# Patient Record
Sex: Male | Born: 1935
Health system: Southern US, Community
[De-identification: ages and names within clinical notes are randomized; demographics above are authoritative.]

## PROBLEM LIST (undated history)

## (undated) DIAGNOSIS — K4090 Unilateral inguinal hernia, without obstruction or gangrene, not specified as recurrent: Secondary | ICD-10-CM

## (undated) DIAGNOSIS — I10 Essential (primary) hypertension: Secondary | ICD-10-CM

## (undated) DIAGNOSIS — J3489 Other specified disorders of nose and nasal sinuses: Secondary | ICD-10-CM

## (undated) DIAGNOSIS — R609 Edema, unspecified: Secondary | ICD-10-CM

## (undated) DIAGNOSIS — H547 Unspecified visual loss: Secondary | ICD-10-CM

## (undated) DIAGNOSIS — F0280 Dementia in other diseases classified elsewhere without behavioral disturbance: Secondary | ICD-10-CM

## (undated) DIAGNOSIS — R350 Frequency of micturition: Secondary | ICD-10-CM

## (undated) DIAGNOSIS — B079 Viral wart, unspecified: Secondary | ICD-10-CM

## (undated) DIAGNOSIS — M549 Dorsalgia, unspecified: Secondary | ICD-10-CM

## (undated) DIAGNOSIS — H04123 Dry eye syndrome of bilateral lacrimal glands: Secondary | ICD-10-CM

## (undated) DIAGNOSIS — G309 Alzheimer's disease, unspecified: Secondary | ICD-10-CM

## (undated) DIAGNOSIS — H409 Unspecified glaucoma: Secondary | ICD-10-CM

## (undated) DIAGNOSIS — R3915 Urgency of urination: Secondary | ICD-10-CM

## (undated) DIAGNOSIS — R531 Weakness: Secondary | ICD-10-CM

## (undated) DIAGNOSIS — K649 Unspecified hemorrhoids: Secondary | ICD-10-CM

## (undated) DIAGNOSIS — G8929 Other chronic pain: Secondary | ICD-10-CM

## (undated) HISTORY — DX: Unspecified glaucoma: H40.9

## (undated) HISTORY — PX: EYE SURGERY: SHX253

## (undated) HISTORY — PX: HEMICOLECTOMY: SHX854

## (undated) HISTORY — PX: TONSILLECTOMY: SUR1361

## (undated) HISTORY — DX: Essential (primary) hypertension: I10

## (undated) HISTORY — PX: COLONOSCOPY: SHX174

---

## 2006-02-27 HISTORY — PX: HERNIA REPAIR: SHX51

## 2006-10-29 ENCOUNTER — Inpatient Hospital Stay (HOSPITAL_COMMUNITY): Admission: EM | Admit: 2006-10-29 | Discharge: 2006-11-01 | Payer: Self-pay | Admitting: Emergency Medicine

## 2010-07-12 NOTE — Op Note (Signed)
NAME:  Anthony Tyler, Anthony Tyler NO.:  [id number]   MEDICAL RECORD NO.:  [id number]          PATIENT TYPE:  INP   LOCATION:  5703                         FACILITY:  [facility]   PHYSICIAN:  Adolph Pollack, M.D.DATE OF BIRTH:  1930/04/21   DATE OF PROCEDURE:  DATE OF DISCHARGE:                               OPERATIVE REPORT   PREOPERATIVE DIAGNOSIS:  Incarcerated right inguinal hernia leading to a  partial large bowel obstruction.   POSTOPERATIVE DIAGNOSIS:  Incarcerated right inguinal hernia leading to  a partial large bowel obstruction.   PROCEDURE:  Repair of incarcerated right inguinal hernia with mesh.   ANESTHESIA:  General.   INDICATIONS:  The patient is a 77 year old male states he has had a  large right inguinal hernia that has descended into his scrotum over  time.  He has no primary care physician.  He fell off a ladder and then  began noticing some abdominal distention, constipation but was passing  some gas, began having some cramping and nausea.  He presented to the  emergency department and was found to have a large bowel obstruction  which was felt to be secondary to sigmoid colon and this large right  inguinal hernia.  Hernia is only temporarily reducible and comes right  back out and he is now brought to the operating room for urgent repair.   TECHNIQUE:  He is seen in the holding area and the right groin marked  with my initials.  He was then brought to the operating room, placed on  the operating table and general anesthetic was administered.  A Foley  catheter was placed in the bladder.  The hair on the groin and the  abdominal wall was clipped and the area sterilely prepped and draped.  Marcaine solution was infiltrated in the right groin and right groin  incision was made through the skin and subcutaneous tissue and Scarpas  fascia until the external oblique aponeurosis was exposed.  I made a  small incision in external oblique aponeurosis and  extended it through  the external ring medially and up toward the anterior superior iliac  spine laterally.  Using blunt dissection, I identified the shelving edge  of the inguinal ligament inferiorly and the internal oblique muscle  aponeurosis superiorly.  The inguinal nerve was identified and retracted  inferiorly away from the plane of dissection.  I then isolated the  spermatic cord and noticed a large sac with it.  I created a window  around this.  I then reduced the contents of the sac of the hernia.  Using careful blunt dissection and select electrocautery, I then  dissected a very large indirect hernia sac away off of the spermatic  cord as well as some extraperitoneal fat which I excised.  I then opened  up the sac and pulled out the sigmoid colon, inspected it.  It was  viable and I reduced it back into the peritoneal space.  High ligation  of the sac was then performed with the 2-0 Vicryl suture and excess sac  was cut off and sent to pathology.   Following this I  brought a 3 x 6 inch piece of polypropylene mesh into  the field and anchored it 2 cm medial to the pubic tubercle with 2-0  Prolene suture.  The inferior aspect of mesh was then anchored to the  shelving edge of the inguinal ligament with a running 2-0 Prolene suture  up to level 2 cm lateral to the internal ring.  A slit was cut in the  mesh, two tails wrapped around the cord.  The superior aspect of mesh  was then anchored to the internal oblique aponeurosis with interrupted 2-  0 Vicryl sutures.  Following this, the two tails of the mesh were  crossed creating a new internal ring and then anchored to the shelving  edge of the inguinal ligament with single 2-0 Prolene suture.  The  lateral aspect of the mesh was then tucked deep to the external oblique  aponeurosis.  I then inspected the area and hemostasis was adequate.  I  closed the external oblique aponeurosis over the mesh and cord with a  running 3-0   Vicryl suture.  Scarpas fascia was closed with a running 2-0 Vicryl  suture.  The skin was closed with a 4-0 Monocryl subcuticular stitch  followed by Steri-Strips and sterile dressing.  He tolerated the  procedure without any apparent complications and was taken to recovery  in satisfactory condition.      Adolph Pollack, M.D.  Electronically Signed     TJR/MEDQ  D:  10/29/2006  T:  10/29/2006  Job:  [id number]

## 2010-07-12 NOTE — H&P (Signed)
NAME:  Anthony Tyler, Anthony Tyler NO.:  [id number]   MEDICAL RECORD NO.:  [id number]          PATIENT TYPE:  EMS   LOCATION:  MAJO                         FACILITY:  [facility]   PHYSICIAN:  Adolph Pollack, M.D.DATE OF BIRTH:  1930/05/18   DATE OF ADMISSION:  10/29/2006  DATE OF DISCHARGE:                              HISTORY & PHYSICAL   REASON FOR ADMISSION:  Colonic obstruction.   HISTORY OF PRESENT ILLNESS:  This is a 77 year old male who according to  him has not been to a physician in 50 years.  He has had a known right  inguinal hernia to himself for the past 20 years.  It has been slowly  getting larger.  Two days ago, he was on a ladder and fell striking the  right paralumbar area of his back.  Following that, he reports  constipation, abdominal bloating and crampy abdominal pain with nausea,  but no vomiting.  No fever or chills.  Passing some gas.  He tried an  enema last night without success.  He then presented to the emergency  department for evaluation where he was found to have a large colonic  bowel obstruction secondary to a large right inguinal hernia containing  sigmoid colon.  It was at this point I was asked to see him.   PAST MEDICAL HISTORY:  1. Hypertension that is untreated as he has no primary care physician.  2. Nephrolithiasis.   PAST SURGICAL HISTORY:  Tonsillectomy.   ALLERGIES:  NONE.   MEDICATIONS:  None.   SOCIAL HISTORY:  Married and here with his wife.  No tobacco or alcohol  use.   REVIEW OF SYSTEMS:  GENERAL:  Generally he was in his normal state of  health prior to this.  CARDIOVASCULAR:  He says he has known that he has  had high blood pressure for a long period of time, but has chosen not to  seek medical attention and have it treated.  No chest pain.  No known  heart disease.  PULMONARY:  No pneumonia, asthma or chronic obstructive  pulmonary disease.  GI:  No peptic ulcer disease, hepatitis or melena or  hematochezia.  GU:   Kidney stones as above.  NEUROLOGIC:  No strokes or  seizures.  HEMATOLOGIC:  No bleeding disorders, transfusions or blood  clots.   PHYSICAL EXAMINATION:  GENERAL:  Elderly male who is in no acute  distress, pleasant and cooperative.  VITAL SIGNS:  Temperature is 97.7, blood pressure is 185/101, pulse is  89.  EYES:  Extraocular muscles intact, no icterus.  NOSE:  Prominent with some irregularity, but no suspicious moles or  lesions.  NECK:  Supple without obvious masses.  RESPIRATORY:  Breath sounds are equal and clear.  Respirations  unlabored.  CARDIAC:  Heart demonstrates a regular rate, regular rhythm.  No murmur.  No lower extremity edema.  ABDOMEN:  Slightly firm, distended and tympanitic with very mild  epigastric tenderness but no peritoneal signs.  Hypoactive bowel sounds  are noted.  GU: There is a large right scrotal hernia that I can reduce but only  temporarily  as it pops back up fairly quickly.  He has a smaller left  inguinal hernia that is easily reducible.  Both testicles are descended.  MUSCULOSKELETAL:  He has good muscle tone, good range of motion.   LABORATORY DATA:  Hemoglobin 16.5,  platelet count 188,000.  Electrolytes are notable for a glucose of 134, creatinine 1.3.  Urinalysis negative.  Abdominal x-rays demonstrate colonic dilatation,  but no free air.  CT of the abdomen demonstrates colonic dilatation with  sigmoid colon going down into the scrotal area and this appears to be a  transition point for his partial colonic obstruction.   IMPRESSION:  1. Partial colon obstruction, most likely secondary to large right      inguinal hernia containing sigmoid colon.  Also, there is a chance      he could have a neoplastic process once this is reduced.  2. Hypertension, untreated.  The patient has no primary physician.  3. Mild dehydration.   PLAN:  Will admit to the hospital, start IV fluid hydration.  He will  need to go to the operating room for urgent  right inguinal hernia repair  with mesh and possible bowel resection.  I have also explained to him  that further workup of his large intestine may be needed following this  procedure.  We discussed the risks of procedure including but not  limited to bleeding, infection, wound healing problems, anesthesia,  accidental damage to intraabdominal organs, recurrence, chronic pain  from nerve entrapment, and testicular ischemia.  He and his wife seem to  understand this and agree with the plan.      Adolph Pollack, M.D.  Electronically Signed     TJR/MEDQ  D:  10/29/2006  T:  10/29/2006  Job:  [id number]

## 2010-07-15 NOTE — Discharge Summary (Signed)
NAME:  Anthony Tyler, Anthony Tyler NO.:  [id number]   MEDICAL RECORD NO.:  [id number]          PATIENT TYPE:  INP   LOCATION:  5703                         FACILITY:  [facility]   PHYSICIAN:  Cherylynn Ridges, M.D.    DATE OF BIRTH:  [birth year]   DATE OF ADMISSION:  10/29/2006  DATE OF DISCHARGE:  11/01/2006                               DISCHARGE SUMMARY   OPERATIVE PHYSICIAN:  Adolph Pollack, M.D.   CHIEF COMPLAINT/REASON FOR ADMISSION:  Mr. Muff is a 77 year old male  patient who has had a right inguinal hernia for 20 years, slowly  increasing in size.  Several days prior to admission he fell on a ladder  and struck the right paralumbar area of his back.  He developed  constipation, abdominal bloating and crampy abdominal pain and nausea,  no vomiting.  After this he was passing some flatus but was unable to  have bowel movement.  He presented to the ER because of continued pain  and was found to have a large colonic bowel obstruction secondary to a  large right inguinal hernia containing sigmoid colon.   On exam the patient was hypertensive but afebrile.  His abdomen was  slightly firm, distended and tympanitic with mild epigastric tenderness,  hypoactive bowel sounds, no peritoneal signs.  On genitourinary he was  found to have a right scrotal hernia that was very large, partially  reducible but popping up fairly quickly.  No other problems found on  clinical exam.   His white count was elevated at 15,500 with neutrophils of 80%, and a CT  scan demonstrated sigmoid colon tracking into the scrotal area with this  being a probable transition point for his partial colonic obstruction.   The patient was admitted with the following diagnoses:  1. Partial colon obstruction secondary to a large right inguinal      hernia with sigmoid colon.  2. Hypertension, untreated.  3. Mild dehydration.   HOSPITAL COURSE:  The patient was admitted and taken to the OR  emergently on  the date of admission by Dr. Abbey Chatters, where he  underwent repair of an incarcerated right inguinal hernia with mesh.  Per the operative note that there were no ischemic changes to the  intestine so no bowel resection was indicated.  The patient tolerated  the procedure well and was sent back to the general floor to recover.   Postop day #1, the patient was doing well.  He had bowel sounds.  The  wound was stable but he had a significant amount of swelling and  discoloration in the scrotal region.  His white count was down to  13,700.  He was still hypertensive so a Catapres patch was added.  His  Foley catheter was discontinued.   For the next several days the patient continued to improve.  His diet  was advanced.  X-ray showed no acute issues and by postoperative day #3,  he was deemed appropriate for discharge home.  He was still mildly  hypertensive and according to the patient, he had not followed up with a  doctor in several years  and therefore it was determined we would  discharge him on the Catapres patch with recommendations to establish  with a primary care physician after discharge for monitoring of the  hypertension.   DISCHARGE DIAGNOSES:  1. Repair of incarcerated right inguinal hernia causing partial      colonic obstruction.  2. New diagnosis of hypertension.   DISCHARGE MEDICATIONS:  Unfortunately, I am unable to locate the copy of  the discharge paperwork given to the patient after discharge.  I did  discharge this patient and I do know that I wrote him a prescription for  the Catapres patch which he was on here at the hospital, which is a TTS-  2 level.  He was also given medication for pain after discharge and at  the hospital, the patient was utilizing Percocet for pain, and our  routine instructions for any patient undergoing hernia repair is no  lifting for 4-6 weeks, shower for next 2 weeks and follow up with the  physician in 2 weeks.  Please call for  appointment.  In addition, as  noted in my progress notes, I have asked the patient to contact a  primary care physician in the area through his insurance company to  clarify who he can see and follow up regarding his hypertension.      Allison L. Rennis Harding, N.P.      Cherylynn Ridges, M.D.  Electronically Signed    ALE/MEDQ  D:  11/28/2006  T:  11/29/2006  Job:  [id number]   cc:   Adolph Pollack, M.D.

## 2010-12-09 LAB — BASIC METABOLIC PANEL
BUN: 13
CO2: 24
Chloride: 104
Chloride: 107
Creatinine, Ser: 1.23
GFR calc Af Amer: >60
GFR calc Af Amer: >60
Potassium: 4.1

## 2010-12-09 LAB — CBC
HCT: 42.2
HCT: 44.8
HCT: 47.9
MCHC: 34.3
MCV: 94.2
MCV: 95.4
MCV: 96.6
RBC: 4.37
RBC: 4.76
RBC: 5.02
WBC: 15.4 — ABNORMAL HIGH
WBC: 15.5 — ABNORMAL HIGH

## 2010-12-09 LAB — DIFFERENTIAL
Eosinophils Absolute: 0
Eosinophils Relative: 0
Lymphs Abs: 2.3
Monocytes Relative: 5

## 2010-12-09 LAB — URINALYSIS, ROUTINE W REFLEX MICROSCOPIC
Glucose, UA: NEGATIVE
Ketones, ur: 15 — AB
Protein, ur: 100 — AB

## 2011-03-31 HISTORY — PX: CATARACT EXTRACTION: SUR2

## 2011-06-19 DIAGNOSIS — J309 Allergic rhinitis, unspecified: Secondary | ICD-10-CM | POA: Insufficient documentation

## 2011-07-18 DIAGNOSIS — R5383 Other fatigue: Secondary | ICD-10-CM | POA: Insufficient documentation

## 2011-10-25 ENCOUNTER — Encounter (INDEPENDENT_AMBULATORY_CARE_PROVIDER_SITE_OTHER): Payer: Self-pay | Admitting: General Surgery

## 2011-11-02 ENCOUNTER — Ambulatory Visit (INDEPENDENT_AMBULATORY_CARE_PROVIDER_SITE_OTHER): Payer: Medicare Other | Admitting: Surgery

## 2011-11-02 ENCOUNTER — Encounter (INDEPENDENT_AMBULATORY_CARE_PROVIDER_SITE_OTHER): Payer: Self-pay | Admitting: Surgery

## 2011-11-02 VITALS — BP 144/92 | HR 72 | Temp 98.1°F | Resp 18 | Ht 74.0 in | Wt 204.4 lb

## 2011-11-02 DIAGNOSIS — L711 Rhinophyma: Secondary | ICD-10-CM | POA: Insufficient documentation

## 2011-11-02 DIAGNOSIS — D126 Benign neoplasm of colon, unspecified: Secondary | ICD-10-CM

## 2011-11-02 DIAGNOSIS — L719 Rosacea, unspecified: Secondary | ICD-10-CM

## 2011-11-02 DIAGNOSIS — K635 Polyp of colon: Secondary | ICD-10-CM | POA: Insufficient documentation

## 2011-11-02 NOTE — Progress Notes (Signed)
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  Report reviewed from Dr. Alisia Ferrari colonoscopy.  I explained the procedure to him, his daughter and wife. He is aware of the risk and indications the procedure and complications not limited to and need for temporary ostomy or leak or perforation. He wants to proceed with scheduling. A booklet on colon surgery was given to them for their review.    Past Medical History  Diagnosis Date  . Hypertension   . Glaucoma   . Cataract     Past Surgical History  Procedure Date  . Cataract extraction 03/2011    right  . Hernia repair 2008    Current Outpatient Prescriptions  Medication Sig Dispense Refill  . dorzolamide (TRUSOPT) 2 % ophthalmic solution 1 drop 3 (three) times daily.      . nebivolol (BYSTOLIC) 5 MG tablet Take 5 mg by mouth daily.       Review of patient's allergies indicates no known allergies. Family History  Problem Relation Age of Onset  . Stroke Mother    Social History:   reports that he quit smoking about 64 years ago. He does not have any smokeless tobacco history on file. He reports that he does not drink alcohol or use illicit drugs.   REVIEW OF SYSTEMS - PERTINENT POSITIVES ONLY: Negative for DVT  Physical Exam:   Blood pressure 144/92, pulse 72, temperature 98.1 F (36.7 C), temperature source Temporal, resp. rate 18, height 6\' 2"  (1.88 m), weight 204 lb 6.4 oz (92.715 kg). Body mass index is 26.24 kg/(m^2).  Gen:  WDWN WM NAD  Neurological: Alert and oriented to person, place, and time. Motor and sensory function is grossly intact  Head: Normocephalic and atraumatic. Large rhymophyma Eyes: Conjunctivae are normal. Pupils are equal, round, and reactive to light. No scleral icterus.  Neck: Normal range of motion. Neck  supple. No tracheal deviation or thyromegaly present.  Cardiovascular:  SR without murmurs or gallops.  No carotid bruits Respiratory: Effort normal.  No respiratory distress. No chest wall tenderness. Breath sounds normal.  No wheezes, rales or rhonchi.  Abdomen:  nontender and no masses GU: Musculoskeletal: Normal range of motion. Extremities are nontender. No cyanosis, edema or clubbing noted Lymphadenopathy: No cervical, preauricular, postauricular or axillary adenopathy is present Skin: Skin is warm and dry. No rash noted. No diaphoresis. No erythema. No pallor. Pscyh: Normal mood and affect. Behavior is normal. Judgment and thought content normal.   LABORATORY RESULTS: No results found for this or any previous visit (from the past 48 hour(s)).  RADIOLOGY RESULTS: No results found.  Problem List: There is no problem list on file for this patient.   Assessment & Plan: Large cecal polyp with dysplasia.  For lap assisted right hemicolectomy.  Procedure explained to patient and family.    Matt B. Daphine Deutscher, MD, [facility] [phone] beeper [phone]  11/02/2011 12:20 PM

## 2011-11-03 ENCOUNTER — Ambulatory Visit (HOSPITAL_COMMUNITY)
Admission: RE | Admit: 2011-11-03 | Discharge: 2011-11-03 | Disposition: A | Discharge status: Home / Self Care | Payer: Medicare Other | Source: Ambulatory Visit | Attending: Surgery | Admitting: Surgery

## 2011-11-03 ENCOUNTER — Encounter (HOSPITAL_COMMUNITY): Payer: Self-pay | Admitting: Pharmacy Technician

## 2011-11-03 ENCOUNTER — Other Ambulatory Visit (HOSPITAL_COMMUNITY): Payer: Medicare Other

## 2011-11-03 ENCOUNTER — Encounter (HOSPITAL_COMMUNITY)
Admission: RE | Admit: 2011-11-03 | Discharge: 2011-11-03 | Disposition: A | Discharge status: Home / Self Care | Payer: Medicare Other | Source: Ambulatory Visit | Attending: Surgery | Admitting: Surgery

## 2011-11-03 ENCOUNTER — Encounter (HOSPITAL_COMMUNITY): Payer: Self-pay

## 2011-11-03 DIAGNOSIS — D126 Benign neoplasm of colon, unspecified: Secondary | ICD-10-CM | POA: Insufficient documentation

## 2011-11-03 DIAGNOSIS — Z01812 Encounter for preprocedural laboratory examination: Secondary | ICD-10-CM | POA: Insufficient documentation

## 2011-11-03 DIAGNOSIS — I10 Essential (primary) hypertension: Secondary | ICD-10-CM | POA: Insufficient documentation

## 2011-11-03 DIAGNOSIS — Z01810 Encounter for preprocedural cardiovascular examination: Secondary | ICD-10-CM | POA: Insufficient documentation

## 2011-11-03 HISTORY — DX: Other specified disorders of nose and nasal sinuses: J34.89

## 2011-11-03 HISTORY — DX: Unspecified visual loss: H54.7

## 2011-11-03 HISTORY — DX: Viral wart, unspecified: B07.9

## 2011-11-03 LAB — BASIC METABOLIC PANEL
Chloride: 103 mEq/L (ref 96–112)
GFR calc Af Amer: 63 mL/min — ABNORMAL LOW (ref >90)
GFR calc non Af Amer: 55 mL/min — ABNORMAL LOW (ref >90)
Potassium: 4.4 mEq/L (ref 3.5–5.1)

## 2011-11-03 LAB — SURGICAL PCR SCREEN
MRSA, PCR: NEGATIVE
Staphylococcus aureus: NEGATIVE

## 2011-11-03 LAB — CBC
MCHC: 34.8 g/dL (ref 30.0–36.0)
Platelets: 203 10*3/uL (ref 150–400)
RDW: 12.9 % (ref 11.5–15.5)
WBC: 8.5 10*3/uL (ref 4.0–10.5)

## 2011-11-03 LAB — ABO/RH: ABO/RH(D): A POS

## 2011-11-03 MED ORDER — CHLORHEXIDINE GLUCONATE 4 % EX LIQD: 1.0000 "application " | Freq: Once | CUTANEOUS | Status: DC

## 2011-11-03 NOTE — Patient Instructions (Signed)
YOUR SURGERY IS SCHEDULED ON:   Tuesday  9/10  AT 1:00 PM  REPORT TO [place] [facility] AT:  11:00 AM      PHONE # FOR SHORT STAY IS [phone] FOLLOW YOUR BOWEL PREP INSTRUCTIONS FROM DR. MARTIN'S OFFICE --AND DO FLEET'S ENEMA  THE NIGHT BEFORE YOUR SURGERY.  DO NOT EAT ANYTHING AFTER MIDNIGHT THE NIGHT BEFORE YOUR SURGERY.   NO FOOD, NO CHEWING GUM, NO MINTS, NO CANDIES, NO CHEWING TOBACCO. YOU MAY HAVE CLEAR LIQUIDS TO DRINK FROM MIDNIGHT THE NIGHT BEFORE YOUR SURGERY UNTIL 7:00 AM DAY OF SURGERY--LIKE WATER, MOUNTAIN DEW AND GATORADE.   NOTHING TO DRINK AFTER 7:00 AM DAY OF SURGERY.  PLEASE TAKE THE FOLLOWING MEDICATIONS THE AM OF YOUR SURGERY WITH A FEW SIPS OF WATER:  BYSTOLIC    AND USE YOUR EYE DROPS AND BRING YOUR EYE DROPS TO HOSPTIAL    DO NOT BRING VALUABLES, MONEY, CREDIT CARDS.  CONTACT LENS, DENTURES / PARTIALS, GLASSES SHOULD NOT BE WORN TO SURGERY AND IN MOST CASES-HEARING AIDS WILL NEED TO BE REMOVED.  BRING YOUR GLASSES CASE, ANY EQUIPMENT NEEDED FOR YOUR CONTACT LENS. FOR PATIENTS ADMITTED TO THE HOSPITAL--CHECK OUT TIME THE DAY OF DISCHARGE IS 11:00 AM.  ALL INPATIENT ROOMS ARE PRIVATE - WITH BATHROOM, TELEPHONE, TELEVISION AND WIFI INTERNET. IF YOU ARE BEING DISCHARGED THE SAME DAY OF YOUR SURGERY--YOU CAN NOT DRIVE YOURSELF HOME--AND SHOULD NOT GO HOME ALONE BY TAXI OR BUS.  NO DRIVING OR OPERATING MACHINERY FOR 24 HOURS FOLLOWING ANESTHESIA / PAIN MEDICATIONS.                            SPECIAL INSTRUCTIONS:  CHLORHEXIDINE SOAP SHOWER (other brand names are Betasept and Hibiclens ) PLEASE SHOWER WITH CHLORHEXIDINE THE NIGHT BEFORE YOUR SURGERY AND THE AM OF YOUR SURGERY. DO NOT USE CHLORHEXIDINE ON YOUR FACE OR PRIVATE AREAS--YOU MAY USE YOUR NORMAL SOAP THOSE AREAS AND YOUR NORMAL SHAMPOO.  WOMEN SHOULD AVOID SHAVING UNDER ARMS AND SHAVING LEGS 48 HOURS BEFORE USING CHLORHEXIDINE TO AVOID SKIN IRRITATION.  DO NOT USE IF ALLERGIC TO CHLORHEXIDINE.  PLEASE  READ OVER ANY  FACT SHEETS THAT YOU WERE GIVEN: MRSA INFORMATION, BLOOD TRANSFUSION INFORMATION

## 2011-11-03 NOTE — Pre-Procedure Instructions (Signed)
PREOP CBC, BMET, T/S, EKG AND CXR WERE DONE AT [facility] TODAY - AS PER ANESTHESIOLOGIST'S GUIDELINES. PREOP TEACHING DISCUSSED WITH PT USING TEACH BACK METHOD.

## 2011-11-07 ENCOUNTER — Encounter (HOSPITAL_COMMUNITY): Payer: Self-pay | Admitting: *Deleted

## 2011-11-07 ENCOUNTER — Inpatient Hospital Stay (HOSPITAL_COMMUNITY)
Admission: RE | Admit: 2011-11-07 | Discharge: 2011-11-12 | DRG: 331 | Disposition: A | Discharge status: Home / Self Care | Payer: Medicare Other | Source: Ambulatory Visit | Attending: Surgery | Admitting: Surgery

## 2011-11-07 ENCOUNTER — Ambulatory Visit (HOSPITAL_COMMUNITY): Payer: Medicare Other | Admitting: Registered Nurse

## 2011-11-07 ENCOUNTER — Encounter (HOSPITAL_COMMUNITY): Payer: Self-pay | Admitting: Registered Nurse

## 2011-11-07 ENCOUNTER — Encounter (HOSPITAL_COMMUNITY)
Admission: RE | Disposition: A | Discharge status: Home / Self Care | Payer: Self-pay | Source: Ambulatory Visit | Attending: Surgery

## 2011-11-07 DIAGNOSIS — D126 Benign neoplasm of colon, unspecified: Principal | ICD-10-CM | POA: Diagnosis present

## 2011-11-07 DIAGNOSIS — H409 Unspecified glaucoma: Secondary | ICD-10-CM | POA: Diagnosis present

## 2011-11-07 DIAGNOSIS — Z87891 Personal history of nicotine dependence: Secondary | ICD-10-CM

## 2011-11-07 DIAGNOSIS — I10 Essential (primary) hypertension: Secondary | ICD-10-CM | POA: Diagnosis present

## 2011-11-07 DIAGNOSIS — K635 Polyp of colon: Secondary | ICD-10-CM | POA: Diagnosis present

## 2011-11-07 DIAGNOSIS — J00 Acute nasopharyngitis [common cold]: Secondary | ICD-10-CM | POA: Diagnosis present

## 2011-11-07 DIAGNOSIS — Z79899 Other long term (current) drug therapy: Secondary | ICD-10-CM

## 2011-11-07 LAB — TYPE AND SCREEN: ABO/RH(D): A POS

## 2011-11-07 LAB — CBC
HCT: 40.6 % (ref 39.0–52.0)
MCV: 91.6 fL (ref 78.0–100.0)
RBC: 4.43 MIL/uL (ref 4.22–5.81)
RDW: 12.8 % (ref 11.5–15.5)
WBC: 17.2 10*3/uL — ABNORMAL HIGH (ref 4.0–10.5)

## 2011-11-07 LAB — CREATININE, SERUM
GFR calc Af Amer: 74 mL/min — ABNORMAL LOW (ref >90)
GFR calc non Af Amer: 64 mL/min — ABNORMAL LOW (ref >90)

## 2011-11-07 SURGERY — LAPAROSCOPIC RIGHT HEMI COLECTOMY
Anesthesia: General | Site: Abdomen | Laterality: Right | Wound class: Clean Contaminated

## 2011-11-07 MED ORDER — BUPIVACAINE LIPOSOME 1.3 % IJ SUSP
20.0000 mL | INTRAMUSCULAR | Status: DC
  Filled 2011-11-07: qty 20

## 2011-11-07 MED ORDER — DEXTROSE 5 % IV SOLN
2.0000 g | INTRAVENOUS | Status: AC
  Administered 2011-11-07: 2 g via INTRAVENOUS
  Filled 2011-11-07: qty 2

## 2011-11-07 MED ORDER — CEFOXITIN SODIUM 1 G IV SOLR
1.0000 g | Freq: Four times a day (QID) | INTRAVENOUS | Status: AC
  Administered 2011-11-07: 1 g via INTRAVENOUS
  Filled 2011-11-07: qty 1

## 2011-11-07 MED ORDER — ACETAMINOPHEN 10 MG/ML IV SOLN
1000.0000 mg | Freq: Four times a day (QID) | INTRAVENOUS | Status: AC
  Administered 2011-11-07 – 2011-11-08 (×4): 1000 mg via INTRAVENOUS
  Filled 2011-11-07 (×3): qty 100

## 2011-11-07 MED ORDER — KCL IN DEXTROSE-NACL 20-5-0.45 MEQ/L-%-% IV SOLN
INTRAVENOUS | Status: AC
  Filled 2011-11-07: qty 1000

## 2011-11-07 MED ORDER — LIDOCAINE HCL (CARDIAC) 20 MG/ML IV SOLN
INTRAVENOUS | Status: DC | PRN
  Administered 2011-11-07: 100 mg via INTRAVENOUS

## 2011-11-07 MED ORDER — ROCURONIUM BROMIDE 100 MG/10ML IV SOLN
INTRAVENOUS | Status: DC | PRN
  Administered 2011-11-07: 10 mg via INTRAVENOUS
  Administered 2011-11-07: 40 mg via INTRAVENOUS

## 2011-11-07 MED ORDER — NEBIVOLOL HCL 5 MG PO TABS
5.0000 mg | ORAL_TABLET | Freq: Every day | ORAL | Status: DC
  Administered 2011-11-08 – 2011-11-12 (×5): 5 mg via ORAL
  Filled 2011-11-07 (×7): qty 1

## 2011-11-07 MED ORDER — GLYCOPYRROLATE 0.2 MG/ML IJ SOLN
INTRAMUSCULAR | Status: DC | PRN
  Administered 2011-11-07: 0.4 mg via INTRAVENOUS

## 2011-11-07 MED ORDER — MORPHINE SULFATE 2 MG/ML IJ SOLN: 1.0000 mg | INTRAMUSCULAR | Status: DC | PRN

## 2011-11-07 MED ORDER — OXYCODONE-ACETAMINOPHEN 5-325 MG PO TABS: 1.0000 | ORAL_TABLET | ORAL | Status: DC | PRN

## 2011-11-07 MED ORDER — HYDROMORPHONE HCL PF 1 MG/ML IJ SOLN
INTRAMUSCULAR | Status: AC
  Filled 2011-11-07: qty 1

## 2011-11-07 MED ORDER — PROPOFOL 10 MG/ML IV BOLUS
INTRAVENOUS | Status: DC | PRN
  Administered 2011-11-07: 200 mg via INTRAVENOUS

## 2011-11-07 MED ORDER — NEOSTIGMINE METHYLSULFATE 1 MG/ML IJ SOLN
INTRAMUSCULAR | Status: DC | PRN
  Administered 2011-11-07: 3 mg via INTRAVENOUS

## 2011-11-07 MED ORDER — ATROPINE SULFATE 0.4 MG/ML IJ SOLN
INTRAMUSCULAR | Status: DC | PRN
  Administered 2011-11-07: 0.4 mg via INTRAVENOUS

## 2011-11-07 MED ORDER — FENTANYL CITRATE 0.05 MG/ML IJ SOLN
INTRAMUSCULAR | Status: DC | PRN
  Administered 2011-11-07 (×5): 50 ug via INTRAVENOUS

## 2011-11-07 MED ORDER — LACTATED RINGERS IV SOLN
INTRAVENOUS | Status: DC | PRN
  Administered 2011-11-07 (×2): via INTRAVENOUS

## 2011-11-07 MED ORDER — ONDANSETRON HCL 4 MG PO TABS: 4.0000 mg | ORAL_TABLET | Freq: Four times a day (QID) | ORAL | Status: DC | PRN

## 2011-11-07 MED ORDER — ONDANSETRON HCL 4 MG/2ML IJ SOLN
INTRAMUSCULAR | Status: DC | PRN
  Administered 2011-11-07: 4 mg via INTRAVENOUS

## 2011-11-07 MED ORDER — 0.9 % SODIUM CHLORIDE (POUR BTL) OPTIME
TOPICAL | Status: DC | PRN
  Administered 2011-11-07: 1000 mL

## 2011-11-07 MED ORDER — DORZOLAMIDE HCL 2 % OP SOLN
1.0000 [drp] | Freq: Two times a day (BID) | OPHTHALMIC | Status: DC
  Administered 2011-11-07 – 2011-11-12 (×10): 1 [drp] via OPHTHALMIC
  Filled 2011-11-07 (×2): qty 10

## 2011-11-07 MED ORDER — KCL IN DEXTROSE-NACL 20-5-0.45 MEQ/L-%-% IV SOLN
INTRAVENOUS | Status: DC
  Administered 2011-11-08 – 2011-11-09 (×3): via INTRAVENOUS
  Administered 2011-11-09: 75 mL/h via INTRAVENOUS
  Administered 2011-11-11 (×2): via INTRAVENOUS
  Filled 2011-11-07 (×9): qty 1000

## 2011-11-07 MED ORDER — HEPARIN SODIUM (PORCINE) 5000 UNIT/ML IJ SOLN
5000.0000 [IU] | Freq: Three times a day (TID) | INTRAMUSCULAR | Status: DC
  Administered 2011-11-07 – 2011-11-12 (×14): 5000 [IU] via SUBCUTANEOUS
  Filled 2011-11-07 (×17): qty 1

## 2011-11-07 MED ORDER — LACTATED RINGERS IV SOLN: INTRAVENOUS | Status: DC

## 2011-11-07 MED ORDER — FENTANYL CITRATE 0.05 MG/ML IJ SOLN
25.0000 ug | INTRAMUSCULAR | Status: DC | PRN
  Administered 2011-11-07 (×4): 25 ug via INTRAVENOUS

## 2011-11-07 MED ORDER — FENTANYL CITRATE 0.05 MG/ML IJ SOLN
INTRAMUSCULAR | Status: AC
  Filled 2011-11-07: qty 2

## 2011-11-07 MED ORDER — HYDROMORPHONE HCL PF 1 MG/ML IJ SOLN
0.5000 mg | INTRAMUSCULAR | Status: DC | PRN
  Administered 2011-11-07 (×2): 0.5 mg via INTRAVENOUS

## 2011-11-07 MED ORDER — ONDANSETRON HCL 4 MG/2ML IJ SOLN: 4.0000 mg | Freq: Four times a day (QID) | INTRAMUSCULAR | Status: DC | PRN

## 2011-11-07 MED ORDER — HEPARIN SODIUM (PORCINE) 5000 UNIT/ML IJ SOLN
5000.0000 [IU] | Freq: Once | INTRAMUSCULAR | Status: AC
  Administered 2011-11-07: 5000 [IU] via SUBCUTANEOUS
  Filled 2011-11-07: qty 1

## 2011-11-07 MED ORDER — LACTATED RINGERS IR SOLN
Status: DC | PRN
  Administered 2011-11-07: 1000 mL

## 2011-11-07 MED ORDER — ACETAMINOPHEN 10 MG/ML IV SOLN
INTRAVENOUS | Status: AC
  Administered 2011-11-07: 1000 mg
  Filled 2011-11-07: qty 100

## 2011-11-07 MED ORDER — BUPIVACAINE LIPOSOME 1.3 % IJ SUSP
INTRAMUSCULAR | Status: DC | PRN
  Administered 2011-11-07: 20 mL

## 2011-11-07 SURGICAL SUPPLY — 75 items
APPLIER CLIP 5 13 M/L LIGAMAX5 (MISCELLANEOUS)
APPLIER CLIP ROT 10 11.4 M/L (STAPLE)
APR CLP MED LRG 11.4X10 (STAPLE)
APR CLP MED LRG 5 ANG JAW (MISCELLANEOUS)
BLADE EXTENDED COATED 6.5IN (ELECTRODE) ×2 IMPLANT
BLADE HEX COATED 2.75 (ELECTRODE) ×2 IMPLANT
BLADE SURG SZ10 CARB STEEL (BLADE) ×2 IMPLANT
CABLE HIGH FREQUENCY MONO STRZ (ELECTRODE) ×2 IMPLANT
CANISTER SUCTION 2500CC (MISCELLANEOUS) ×2 IMPLANT
CELLS DAT CNTRL 66122 CELL SVR (MISCELLANEOUS) ×1 IMPLANT
CLIP APPLIE 5 13 M/L LIGAMAX5 (MISCELLANEOUS) ×1 IMPLANT
CLIP APPLIE ROT 10 11.4 M/L (STAPLE) ×1 IMPLANT
CLOTH BEACON ORANGE TIMEOUT ST (SAFETY) ×2 IMPLANT
COVER MAYO STAND STRL (DRAPES) ×2 IMPLANT
DECANTER SPIKE VIAL GLASS SM (MISCELLANEOUS) ×1 IMPLANT
DRAIN CHANNEL 19F RND (DRAIN) ×1 IMPLANT
DRAPE LAPAROSCOPIC ABDOMINAL (DRAPES) ×2 IMPLANT
DRAPE LG THREE QUARTER DISP (DRAPES) ×2 IMPLANT
DRAPE WARM FLUID 44X44 (DRAPE) ×2 IMPLANT
ELECT REM PT RETURN 9FT ADLT (ELECTROSURGICAL) ×2
ELECTRODE REM PT RTRN 9FT ADLT (ELECTROSURGICAL) ×1 IMPLANT
GLOVE BIOGEL M 8.0 STRL (GLOVE) ×4 IMPLANT
GLOVE BIOGEL PI IND STRL 7.0 (GLOVE) ×1 IMPLANT
GLOVE BIOGEL PI INDICATOR 7.0 (GLOVE)
GOWN STRL NON-REIN LRG LVL3 (GOWN DISPOSABLE) ×2 IMPLANT
GOWN STRL REIN XL XLG (GOWN DISPOSABLE) ×6 IMPLANT
GRASPER LAPSCPC 5X35 EPIX (ENDOMECHANICALS) IMPLANT
HAND ACTIVATED (MISCELLANEOUS) ×1 IMPLANT
KIT BASIN OR (CUSTOM PROCEDURE TRAY) ×2 IMPLANT
LEGGING LITHOTOMY PAIR STRL (DRAPES) IMPLANT
LIGASURE IMPACT 36 18CM CVD LR (INSTRUMENTS) ×1 IMPLANT
NS IRRIG 1000ML POUR BTL (IV SOLUTION) ×3 IMPLANT
PENCIL BUTTON HOLSTER BLD 10FT (ELECTRODE) ×2 IMPLANT
RELOAD PROXIMATE 75MM BLUE (ENDOMECHANICALS) ×4 IMPLANT
RELOAD STAPLE 75 3.8 BLU REG (ENDOMECHANICALS) IMPLANT
RETRACTOR WND ALEXIS 18 MED (MISCELLANEOUS) IMPLANT
RTRCTR WOUND ALEXIS 18CM MED (MISCELLANEOUS) ×2
SCISSORS LAP 5X35 DISP (ENDOMECHANICALS) ×2 IMPLANT
SEALER TISSUE G2 CVD JAW 35 (ENDOMECHANICALS) IMPLANT
SEALER TISSUE G2 CVD JAW 45CM (ENDOMECHANICALS)
SET IRRIG TUBING LAPAROSCOPIC (IRRIGATION / IRRIGATOR) ×2 IMPLANT
SOLUTION ANTI FOG 6CC (MISCELLANEOUS) ×2 IMPLANT
SPONGE GAUZE 4X4 12PLY (GAUZE/BANDAGES/DRESSINGS) ×1 IMPLANT
SPONGE LAP 18X18 X RAY DECT (DISPOSABLE) ×4 IMPLANT
STAPLER PROXIMATE 75MM BLUE (STAPLE) ×1 IMPLANT
STAPLER VISISTAT 35W (STAPLE) ×1 IMPLANT
SUCTION POOLE TIP (SUCTIONS) ×2 IMPLANT
SUT NOVA 1 T20/GS 25DT (SUTURE) ×2 IMPLANT
SUT PDS AB 1 CTX 36 (SUTURE) ×5 IMPLANT
SUT PDS AB 1 TP1 96 (SUTURE) IMPLANT
SUT PDS AB 4-0 SH 27 (SUTURE) ×3 IMPLANT
SUT PROLENE 2 0 KS (SUTURE) IMPLANT
SUT SILK 2 0 (SUTURE) ×2
SUT SILK 2 0 SH CR/8 (SUTURE) ×2 IMPLANT
SUT SILK 2-0 18XBRD TIE 12 (SUTURE) ×1 IMPLANT
SUT SILK 3 0 (SUTURE) ×2
SUT SILK 3 0 SH CR/8 (SUTURE) ×3 IMPLANT
SUT SILK 3-0 18XBRD TIE 12 (SUTURE) ×1 IMPLANT
SUT VIC AB 2-0 SH 18 (SUTURE) ×2 IMPLANT
SUT VICRYL 2 0 18  UND BR (SUTURE)
SUT VICRYL 2 0 18 UND BR (SUTURE) ×2 IMPLANT
SYR 30ML LL (SYRINGE) IMPLANT
SYS LAPSCP GELPORT 120MM (MISCELLANEOUS)
SYSTEM LAPSCP GELPORT 120MM (MISCELLANEOUS) IMPLANT
TAPE CLOTH SURG 6X10 WHT LF (GAUZE/BANDAGES/DRESSINGS) ×1 IMPLANT
TOWEL OR 17X26 10 PK STRL BLUE (TOWEL DISPOSABLE) ×4 IMPLANT
TRAY FOLEY CATH 14FRSI W/METER (CATHETERS) ×2 IMPLANT
TRAY LAP CHOLE (CUSTOM PROCEDURE TRAY) ×2 IMPLANT
TROCAR XCEL BLUNT TIP 100MML (ENDOMECHANICALS) IMPLANT
TROCAR XCEL NON-BLD 11X100MML (ENDOMECHANICALS) IMPLANT
TROCAR XCEL NON-BLD 5MMX100MML (ENDOMECHANICALS) ×1 IMPLANT
TROCAR Z-THREAD FIOS 5X100MM (TROCAR) ×2 IMPLANT
TUBING FILTER THERMOFLATOR (ELECTROSURGICAL) ×2 IMPLANT
YANKAUER SUCT BULB TIP 10FT TU (MISCELLANEOUS) ×2 IMPLANT
YANKAUER SUCT BULB TIP NO VENT (SUCTIONS) ×2 IMPLANT

## 2011-11-07 NOTE — Anesthesia Procedure Notes (Signed)
Procedure Name: Intubation Date/Time: 11/07/2011 1:23 PM Performed by: Jarvis Newcomer A Pre-anesthesia Checklist: Patient identified, Emergency Drugs available, Suction available, Patient being monitored and Timeout performed Patient Re-evaluated:Patient Re-evaluated prior to inductionOxygen Delivery Method: Circle system utilized Preoxygenation: Pre-oxygenation with 100% oxygen Intubation Type: IV induction Ventilation: Oral airway inserted - appropriate to patient size and Two handed mask ventilation required Laryngoscope Size: Mac and 4 Grade View: Grade II Tube type: Oral Tube size: 7.5 mm Number of attempts: 1 Airway Equipment and Method: Oral airway and Stylet Placement Confirmation: ETT inserted through vocal cords under direct vision,  positive ETCO2 and breath sounds checked- equal and bilateral Secured at: 23 cm Tube secured with: Tape Dental Injury: Teeth and Oropharynx as per pre-operative assessment

## 2011-11-07 NOTE — Transfer of Care (Signed)
Immediate Anesthesia Transfer of Care Note  Patient: Anthony Tyler  Procedure(s) Performed: Procedure(s) (LRB) with comments: LAPAROSCOPIC RIGHT HEMI COLECTOMY (Right) - Laparoscopic Assisted Right Hemicolectomy  Patient Location: PACU  Anesthesia Type: General  Level of Consciousness: awake, alert , oriented and patient cooperative  Airway & Oxygen Therapy: Patient Spontanous Breathing and Patient connected to face mask oxygen  Post-op Assessment: Report given to PACU RN, Post -op Vital signs reviewed and stable and Patient moving all extremities  Post vital signs: Reviewed and stable  Complications: No apparent anesthesia complications

## 2011-11-07 NOTE — Preoperative (Signed)
Beta Blockers   Reason not to administer Beta Blockers:Bystolic taken at 11-07-11 at 0900

## 2011-11-07 NOTE — Anesthesia Preprocedure Evaluation (Addendum)
Anesthesia Evaluation  Patient identified by MRN, date of birth, ID band Patient awake    Reviewed: Allergy & Precautions, H&P , NPO status , Patient's Chart, lab work & pertinent test results, reviewed documented beta blocker date and time   Airway Mallampati: II TM Distance: >3 FB Neck ROM: full    Dental  (+) Edentulous Upper and Edentulous Lower   Pulmonary neg pulmonary ROS,  Stop bang 5 breath sounds clear to auscultation  Pulmonary exam normal       Cardiovascular Exercise Tolerance: Good hypertension, Pt. on medications and Pt. on home beta blockers negative cardio ROS  Rhythm:regular Rate:Normal     Neuro/Psych negative neurological ROS  negative psych ROS   GI/Hepatic negative GI ROS, Neg liver ROS,   Endo/Other  negative endocrine ROS  Renal/GU negative Renal ROS  negative genitourinary   Musculoskeletal   Abdominal   Peds  Hematology negative hematology ROS (+)   Anesthesia Other Findings   Reproductive/Obstetrics negative OB ROS                          Anesthesia Physical Anesthesia Plan  ASA: II  Anesthesia Plan: General   Post-op Pain Management:    Induction: Intravenous  Airway Management Planned: Oral ETT  Additional Equipment:   Intra-op Plan:   Post-operative Plan: Extubation in OR  Informed Consent: I have reviewed the patients History and Physical, chart, labs and discussed the procedure including the risks, benefits and alternatives for the proposed anesthesia with the patient or authorized representative who has indicated his/her understanding and acceptance.   Dental Advisory Given  Plan Discussed with: CRNA and Surgeon  Anesthesia Plan Comments:         Anesthesia Quick Evaluation

## 2011-11-07 NOTE — Anesthesia Postprocedure Evaluation (Signed)
  Anesthesia Post-op Note  Patient: Anthony Tyler  Procedure(s) Performed: Procedure(s) (LRB): LAPAROSCOPIC RIGHT HEMI COLECTOMY (Right)  Patient Location: PACU  Anesthesia Type: General  Level of Consciousness: awake and alert   Airway and Oxygen Therapy: Patient Spontanous Breathing  Post-op Pain: mild  Post-op Assessment: Post-op Vital signs reviewed, Patient's Cardiovascular Status Stable, Respiratory Function Stable, Patent Airway and No signs of Nausea or vomiting  Post-op Vital Signs: stable  Complications: No apparent anesthesia complications

## 2011-11-07 NOTE — H&P (Signed)
Chief Complaint: Cecal polyp with high grade dysplasia  History of Present Illness: Anthony Tyler is an 78 y.o. 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 Report reviewed from Dr. Alisia Ferrari colonoscopy. I explained the procedure to him, his daughter and wife. He is aware of the risk and indications the procedure and complications not limited to and need for temporary ostomy or leak or perforation. He wants to proceed with scheduling. A booklet on colon surgery was given to them for their review.  Past Medical History   Diagnosis  Date   .  Hypertension    .  Glaucoma    .  Cataract     Past Surgical History   Procedure  Date   .  Cataract extraction  03/2011     right   .  Hernia repair  2008    Current Outpatient Prescriptions   Medication  Sig  Dispense  Refill   .  dorzolamide (TRUSOPT) 2 % ophthalmic solution  1 drop 3 (three) times daily.     .  nebivolol (BYSTOLIC) 5 MG tablet  Take 5 mg by mouth daily.      Review of patient's allergies indicates no known allergies.  Family History   Problem  Relation  Age of Onset   .  Stroke  Mother     Social History: reports that he quit smoking about 64 years ago. He does not have any smokeless tobacco history on file. He reports that he does not drink alcohol or use illicit drugs.  REVIEW OF SYSTEMS - PERTINENT POSITIVES ONLY:  Negative for DVT  Physical Exam:  Blood pressure 144/92, pulse 72, temperature 98.1 F (36.7 C), temperature source Temporal, resp. rate 18, height 6\' 2"  (1.88 m), weight 204 lb 6.4 oz (92.715 kg).  Body mass index is 26.24 kg/(m^2).  Gen: WDWN WM NAD  Neurological: Alert and oriented to person, place, and time. Motor and sensory function is grossly intact  Head: Normocephalic and atraumatic. Large rhymophyma  Eyes: Conjunctivae are normal. Pupils are equal, round, and reactive to light. No scleral icterus.  Neck: Normal range of  motion. Neck supple. No tracheal deviation or thyromegaly present.  Cardiovascular: SR without murmurs or gallops. No carotid bruits  Respiratory: Effort normal. No respiratory distress. No chest wall tenderness. Breath sounds normal. No wheezes, rales or rhonchi.  Abdomen: nontender and no masses  GU:  Musculoskeletal: Normal range of motion. Extremities are nontender. No cyanosis, edema or clubbing noted Lymphadenopathy: No cervical, preauricular, postauricular or axillary adenopathy is present Skin: Skin is warm and dry. No rash noted. No diaphoresis. No erythema. No pallor. Pscyh: Normal mood and affect. Behavior is normal. Judgment and thought content normal.  LABORATORY RESULTS:  No results found for this or any previous visit (from the past 48 hour(s)).  RADIOLOGY RESULTS:  No results found.  Problem List:  There is no problem list on file for this patient.   Assessment & Plan:  Large cecal polyp with dysplasia. For lap assisted right hemicolectomy. Procedure explained to patient and family. Patient for surgery on Tuesday, Sept 10,2013. Matt B. Daphine Deutscher, MD, [facility]  [phone] beeper  [phone]

## 2011-11-07 NOTE — Op Note (Signed)
Surgeon: Wenda Low, MD, FACS  Asst:  Gaynelle Adu, MD, FACS  Anes:  GET  Procedure: Lap assisted right hemicolectomy  Diagnosis: Large cecal polyp  Complications: none  EBL:   20  cc  Description of Procedure:  Taken to OR 11 and given general anesthesia.  Prepped with PCMX and draped.  Timeout performed.  Access achieved through the left upper quadrant with 5 mm Optiview and two other 5 mm inserted.  Right colon mobilized with Harmonic scalpel.  Transverse incision made through the supraumbilical trocar and wound protector used.  Right colon delivered to the skin and the terminal ileum was divided.  Mesentery divided with Ligasure.  No palpable nodes.  Ascending/transverse colon divided with GIA.  Functional end to end anastomosis created with GIA.  Common defect closed in 2 layers with 4-0 PDS and 3-0 silk.  2-0 silk approximation of the mesentery.  Exparel injected.  Posterior sheath closed with running #1 PDS and #1 Novafil interrupted closure of the anterior fascia.  Skin closed with staples.     Anthony B. Daphine Deutscher, MD, [facility], [place] [phone]

## 2011-11-08 ENCOUNTER — Telehealth (INDEPENDENT_AMBULATORY_CARE_PROVIDER_SITE_OTHER): Payer: Self-pay | Admitting: General Surgery

## 2011-11-08 LAB — CBC
HCT: 35.7 % — ABNORMAL LOW (ref 39.0–52.0)
Hemoglobin: 12.4 g/dL — ABNORMAL LOW (ref 13.0–17.0)
MCHC: 34.7 g/dL (ref 30.0–36.0)
RDW: 12.8 % (ref 11.5–15.5)
WBC: 11.8 10*3/uL — ABNORMAL HIGH (ref 4.0–10.5)

## 2011-11-08 LAB — COMPREHENSIVE METABOLIC PANEL
ALT: 9 U/L (ref 0–53)
Albumin: 3 g/dL — ABNORMAL LOW (ref 3.5–5.2)
Alkaline Phosphatase: 34 U/L — ABNORMAL LOW (ref 39–117)
BUN: 11 mg/dL (ref 6–23)
Chloride: 102 mEq/L (ref 96–112)
Glucose, Bld: 143 mg/dL — ABNORMAL HIGH (ref 70–99)
Potassium: 4 mEq/L (ref 3.5–5.1)
Sodium: 134 mEq/L — ABNORMAL LOW (ref 135–145)
Total Bilirubin: 0.9 mg/dL (ref 0.3–1.2)
Total Protein: 5.7 g/dL — ABNORMAL LOW (ref 6.0–8.3)

## 2011-11-08 MED ORDER — INFLUENZA VIRUS VACC SPLIT PF IM SUSP
0.5000 mL | INTRAMUSCULAR | Status: AC
  Administered 2011-11-09: 0.5 mL via INTRAMUSCULAR
  Filled 2011-11-08: qty 0.5

## 2011-11-08 NOTE — Progress Notes (Addendum)
Notified Dr. Daphine Deutscher pertaining to patients cardiac rhythm. 1st degree heart block. Strip placed on chart no new orders at this time. Will continue to monitor. Patient asymptomatic and comfortable.

## 2011-11-08 NOTE — Progress Notes (Signed)
Patient ID: Anthony Tyler, male   DOB: 19-Sep-1930, 78 y.o.   MRN: [id number] [facility] Progress Note:   1 Day Post-Op  Subjective: Mental status is clear Objective: Vital signs in last 24 hours: Temp:  [98 F (36.7 C)-98.8 F (37.1 C)] 98.4 F (36.9 C) (09/11 0800) Pulse Rate:  [62-92] 65  (09/11 0907) Resp:  [11-19] 14  (09/11 0907) BP: (115-171)/(58-97) 138/82 mmHg (09/11 0907) SpO2:  [94 %-100 %] 98 % (09/11 0907) Weight:  [208 lb 1.8 oz (94.4 kg)] 208 lb 1.8 oz (94.4 kg) (09/11 0400)  Intake/Output from previous day: 09/10 0701 - 09/11 0700 In: 2900 [I.V.:2900] Out: 2105 [Urine:2080; Blood:25] Intake/Output this shift: Total I/O In: -  Out: 1000 [Urine:1000]  Physical Exam: Work of breathing is  Normal.  Doing well thus far. No pain  Lab Results:  Results for orders placed during the hospital encounter of 11/07/11 (from the past 48 hour(s))  CBC     Status: Abnormal   Collection Time   11/07/11  5:47 PM      Component Value Range Comment   WBC 17.2 (*) 4.0 - 10.5 K/uL    RBC 4.43  4.22 - 5.81 MIL/uL    Hemoglobin 14.3  13.0 - 17.0 g/dL    HCT 40.9  81.1 - 91.4 %    MCV 91.6  78.0 - 100.0 fL    MCH 32.3  26.0 - 34.0 pg    MCHC 35.2  30.0 - 36.0 g/dL    RDW 78.2  95.6 - 21.3 %    Platelets 184  150 - 400 K/uL   CREATININE, SERUM     Status: Abnormal   Collection Time   11/07/11  5:47 PM      Component Value Range Comment   Creatinine, Ser 1.07  0.50 - 1.35 mg/dL    GFR calc non Af Amer 64 (*) >90 mL/min    GFR calc Af Amer 74 (*) >90 mL/min   CBC     Status: Abnormal   Collection Time   11/08/11  3:11 AM      Component Value Range Comment   WBC 11.8 (*) 4.0 - 10.5 K/uL    RBC 3.91 (*) 4.22 - 5.81 MIL/uL    Hemoglobin 12.4 (*) 13.0 - 17.0 g/dL    HCT 08.6 (*) 57.8 - 52.0 %    MCV 91.3  78.0 - 100.0 fL    MCH 31.7  26.0 - 34.0 pg    MCHC 34.7  30.0 - 36.0 g/dL    RDW 46.9  62.9 - 52.8 %    Platelets 159  150 - 400 K/uL   COMPREHENSIVE  METABOLIC PANEL     Status: Abnormal   Collection Time   11/08/11  3:11 AM      Component Value Range Comment   Sodium 134 (*) 135 - 145 mEq/L    Potassium 4.0  3.5 - 5.1 mEq/L    Chloride 102  96 - 112 mEq/L    CO2 26  19 - 32 mEq/L    Glucose, Bld 143 (*) 70 - 99 mg/dL    BUN 11  6 - 23 mg/dL    Creatinine, Ser 4.13  0.50 - 1.35 mg/dL    Calcium 8.2 (*) 8.4 - 10.5 mg/dL    Total Protein 5.7 (*) 6.0 - 8.3 g/dL    Albumin 3.0 (*) 3.5 - 5.2 g/dL    AST 18  0 - 37  U/L    ALT 9  0 - 53 U/L    Alkaline Phosphatase 34 (*) 39 - 117 U/L    Total Bilirubin 0.9  0.3 - 1.2 mg/dL    GFR calc non Af Amer 59 (*) >90 mL/min    GFR calc Af Amer 68 (*) >90 mL/min     Radiology/Results: No results found.  Anti-infectives: Anti-infectives     Start     Dose/Rate Route Frequency Ordered Stop   11/07/11 1800   cefOXitin (MEFOXIN) 1 g in dextrose 5 % 50 mL IVPB        1 g 100 mL/hr over 30 Minutes Intravenous 4 times per day 11/07/11 1648 11/07/11 1910   11/07/11 1046   cefOXitin (MEFOXIN) 2 g in dextrose 5 % 50 mL IVPB        2 g 100 mL/hr over 30 Minutes Intravenous 60 min pre-op 11/07/11 1046 11/07/11 1326          Assessment/Plan: Problem List: Patient Active Problem List  Diagnosis  . Polyp of colon-cecal  . Rhinophyma    Transfer to 5W Continue NPO 1 Day Post-Op    LOS: 1 day   Matt B. Daphine Deutscher, MD, [facility] [phone] beeper [phone]  11/08/2011 10:42 AM

## 2011-11-08 NOTE — Telephone Encounter (Signed)
Spoke with pt's wife and informed her that her husbands first PO appt with Dr. Daphine Deutscher will be on 9/18 and they would need to arrive at 11:05 for an 11:20 appt.

## 2011-11-08 NOTE — Telephone Encounter (Signed)
Message copied by Littie Deeds on Wed Nov 08, 2011  2:01 PM ------      Message from: Marlowe Aschoff      Created: Wed Nov 08, 2011  9:30 AM      Regarding: FW: Dr Jacquiline Doe..1st PO      Contact: [phone]                   ----- Message -----         From: Erin Sons         Sent: 11/02/2011  12:48 PM           To: Marlowe Aschoff, RN      Subject: Dr Jacquiline Doe..1st PO                                       Pt having hemicolectomy sx and need 1st PO appt. Please call pt at [phone]

## 2011-11-08 NOTE — Progress Notes (Signed)
CARE MANAGEMENT NOTE 11/08/2011  Patient:  Anthony Tyler, Anthony Tyler   Account Number:  [id number]  Date Initiated:  11/08/2011  Documentation initiated by:  [facility]  Subjective/Objective Assessment:   patient with hx of colon plolyps requiring hemicoloectomy hx of htn     Action/Plan:   home   Anticipated DC Date:  11/09/2011   Anticipated DC Plan:  HOME/SELF CARE  In-house referral  NA  NA      DC Planning Services  NA      PAC Choice  NA   Choice offered to / List presented to:  NA   DME arranged  NA      DME agency  NA     HH arranged  NA      HH agency  NA   Status of service:  In process, will continue to follow Medicare Important Message given?  NA - LOS <3 / Initial given by admissions (If response is "NO", the following Medicare IM given date fields will be blank) Date Medicare IM given:   Date Additional Medicare IM given:    Discharge Disposition:    Per UR Regulation:  Reviewed for med. necessity/level of care/duration of stay  If discussed at Long Length of Stay Meetings, dates discussed:    Comments:  [id number]/Anthony Varone Earlene Plater, RN, BSN, CCM: CHART REVIEWED AND UPDATED. NO DISCHARGE NEEDS PRESENT AT THIS TIME. CASE MANAGEMENT [phone]

## 2011-11-09 LAB — BASIC METABOLIC PANEL
BUN: 10 mg/dL (ref 6–23)
Calcium: 8.6 mg/dL (ref 8.4–10.5)
Creatinine, Ser: 1.19 mg/dL (ref 0.50–1.35)
GFR calc Af Amer: 65 mL/min — ABNORMAL LOW (ref >90)
GFR calc non Af Amer: 56 mL/min — ABNORMAL LOW (ref >90)

## 2011-11-09 LAB — CBC
HCT: 37.9 % — ABNORMAL LOW (ref 39.0–52.0)
MCH: 32.2 pg (ref 26.0–34.0)
MCHC: 34.8 g/dL (ref 30.0–36.0)
MCV: 92.4 fL (ref 78.0–100.0)
Platelets: 177 10*3/uL (ref 150–400)
RDW: 12.9 % (ref 11.5–15.5)

## 2011-11-09 MED ORDER — PNEUMOCOCCAL VAC POLYVALENT 25 MCG/0.5ML IJ INJ
0.5000 mL | INJECTION | INTRAMUSCULAR | Status: AC
  Administered 2011-11-10: 0.5 mL via INTRAMUSCULAR
  Filled 2011-11-09: qty 0.5

## 2011-11-09 NOTE — Progress Notes (Signed)
Patient ID: Anthony Tyler, male   DOB: [birth year], 78 y.o.   MRN: [id number] [facility] Progress Note:   2 Days Post-Op  Subjective: Mental status is clear.  Sitting up in chair without complaints Objective: Vital signs in last 24 hours: Temp:  [98.4 F (36.9 C)-99.5 F (37.5 C)] 99.5 F (37.5 C) (09/12 0611) Pulse Rate:  [57-72] 69  (09/12 0611) Resp:  [14-19] 18  (09/12 0611) BP: (119-164)/(69-92) 160/84 mmHg (09/12 0611) SpO2:  [93 %-100 %] 95 % (09/12 0611) Weight:  [208 lb 1.8 oz (94.4 kg)] 208 lb 1.8 oz (94.4 kg) (09/11 0830)  Intake/Output from previous day: 09/11 0701 - 09/12 0700 In: 863.3 [I.V.:863.3] Out: 2775 [Urine:2775] Intake/Output this shift:    Physical Exam: Work of breathing is  Normal.  Minimal incisional pain  Lab Results:  Results for orders placed during the hospital encounter of 11/07/11 (from the past 48 hour(s))  CBC     Status: Abnormal   Collection Time   11/07/11  5:47 PM      Component Value Range Comment   WBC 17.2 (*) 4.0 - 10.5 K/uL    RBC 4.43  4.22 - 5.81 MIL/uL    Hemoglobin 14.3  13.0 - 17.0 g/dL    HCT 40.9  81.1 - 91.4 %    MCV 91.6  78.0 - 100.0 fL    MCH 32.3  26.0 - 34.0 pg    MCHC 35.2  30.0 - 36.0 g/dL    RDW 78.2  95.6 - 21.3 %    Platelets 184  150 - 400 K/uL   CREATININE, SERUM     Status: Abnormal   Collection Time   11/07/11  5:47 PM      Component Value Range Comment   Creatinine, Ser 1.07  0.50 - 1.35 mg/dL    GFR calc non Af Amer 64 (*) >90 mL/min    GFR calc Af Amer 74 (*) >90 mL/min   CBC     Status: Abnormal   Collection Time   11/08/11  3:11 AM      Component Value Range Comment   WBC 11.8 (*) 4.0 - 10.5 K/uL    RBC 3.91 (*) 4.22 - 5.81 MIL/uL    Hemoglobin 12.4 (*) 13.0 - 17.0 g/dL    HCT 08.6 (*) 57.8 - 52.0 %    MCV 91.3  78.0 - 100.0 fL    MCH 31.7  26.0 - 34.0 pg    MCHC 34.7  30.0 - 36.0 g/dL    RDW 46.9  62.9 - 52.8 %    Platelets 159  150 - 400 K/uL   COMPREHENSIVE METABOLIC PANEL      Status: Abnormal   Collection Time   11/08/11  3:11 AM      Component Value Range Comment   Sodium 134 (*) 135 - 145 mEq/L    Potassium 4.0  3.5 - 5.1 mEq/L    Chloride 102  96 - 112 mEq/L    CO2 26  19 - 32 mEq/L    Glucose, Bld 143 (*) 70 - 99 mg/dL    BUN 11  6 - 23 mg/dL    Creatinine, Ser 4.13  0.50 - 1.35 mg/dL    Calcium 8.2 (*) 8.4 - 10.5 mg/dL    Total Protein 5.7 (*) 6.0 - 8.3 g/dL    Albumin 3.0 (*) 3.5 - 5.2 g/dL    AST 18  0 - 37 U/L  ALT 9  0 - 53 U/L    Alkaline Phosphatase 34 (*) 39 - 117 U/L    Total Bilirubin 0.9  0.3 - 1.2 mg/dL    GFR calc non Af Amer 59 (*) >90 mL/min    GFR calc Af Amer 68 (*) >90 mL/min   CBC     Status: Abnormal   Collection Time   11/09/11  4:07 AM      Component Value Range Comment   WBC 12.8 (*) 4.0 - 10.5 K/uL    RBC 4.10 (*) 4.22 - 5.81 MIL/uL    Hemoglobin 13.2  13.0 - 17.0 g/dL    HCT 16.1 (*) 09.6 - 52.0 %    MCV 92.4  78.0 - 100.0 fL    MCH 32.2  26.0 - 34.0 pg    MCHC 34.8  30.0 - 36.0 g/dL    RDW 04.5  40.9 - 81.1 %    Platelets 177  150 - 400 K/uL   BASIC METABOLIC PANEL     Status: Abnormal   Collection Time   11/09/11  4:07 AM      Component Value Range Comment   Sodium 140  135 - 145 mEq/L    Potassium 3.9  3.5 - 5.1 mEq/L    Chloride 106  96 - 112 mEq/L    CO2 26  19 - 32 mEq/L    Glucose, Bld 114 (*) 70 - 99 mg/dL    BUN 10  6 - 23 mg/dL    Creatinine, Ser 9.14  0.50 - 1.35 mg/dL    Calcium 8.6  8.4 - 78.2 mg/dL    GFR calc non Af Amer 56 (*) >90 mL/min    GFR calc Af Amer 65 (*) >90 mL/min     Radiology/Results: No results found.  Anti-infectives: Anti-infectives     Start     Dose/Rate Route Frequency Ordered Stop   11/07/11 1800   cefOXitin (MEFOXIN) 1 g in dextrose 5 % 50 mL IVPB        1 g 100 mL/hr over 30 Minutes Intravenous 4 times per day 11/07/11 1648 11/07/11 1910   11/07/11 1046   cefOXitin (MEFOXIN) 2 g in dextrose 5 % 50 mL IVPB        2 g 100 mL/hr over 30 Minutes Intravenous 60  min pre-op 11/07/11 1046 11/07/11 1326          Assessment/Plan: Problem List: Patient Active Problem List  Diagnosis  . Polyp of colon-cecal  . Rhinophyma    Had flatus.  Will begin clears po.  Path pending.  2 Days Post-Op    LOS: 2 days   Matt B. Daphine Deutscher, MD, [facility] [phone] beeper [phone]  11/09/2011 7:47 AM

## 2011-11-09 NOTE — Progress Notes (Signed)
Pt became confused after walking out of his restroom and wondered into the hallway asking for his room. He was redirected and made comfortable in his bed. Bed alarm was set, call light placed within reach, urinal and bedside table were also positioned within reach. The pt then again came out of bed and wondered into another patients room. He was once again redirected back to his room and made comfortable and reassured. Bed alarm was turned on again and all items placed within patients reach. He appears to be confused and forgetful.

## 2011-11-09 NOTE — Progress Notes (Signed)
On call MD notified of pt's change in mental status.

## 2011-11-10 NOTE — Progress Notes (Signed)
Patient ID: Anthony Tyler, male   DOB: 1930-03-10, 78 y.o.   MRN: [id number] [facility] Progress Note:   3 Days Post-Op  Subjective: Mental status is clear Objective: Vital signs in last 24 hours: Temp:  [98.2 F (36.8 C)-98.8 F (37.1 C)] 98.8 F (37.1 C) (09/13 0532) Pulse Rate:  [51-61] 61  (09/13 0532) Resp:  [16-18] 18  (09/13 0532) BP: (112-146)/(68-90) 146/90 mmHg (09/13 0532) SpO2:  [96 %-98 %] 97 % (09/13 0532)  Intake/Output from previous day: 09/12 0701 - 09/13 0700 In: 4055 [P.O.:2020; I.V.:2035] Out: 1777 [Urine:1776; Stool:1] Intake/Output this shift: Total I/O In: -  Out: 400 [Urine:400]  Physical Exam: Work of breathing is  Normal.  Incisions are clean and dry.  No erythema  Lab Results:  Results for orders placed during the hospital encounter of 11/07/11 (from the past 48 hour(s))  CBC     Status: Abnormal   Collection Time   11/09/11  4:07 AM      Component Value Range Comment   WBC 12.8 (*) 4.0 - 10.5 K/uL    RBC 4.10 (*) 4.22 - 5.81 MIL/uL    Hemoglobin 13.2  13.0 - 17.0 g/dL    HCT 95.2 (*) 84.1 - 52.0 %    MCV 92.4  78.0 - 100.0 fL    MCH 32.2  26.0 - 34.0 pg    MCHC 34.8  30.0 - 36.0 g/dL    RDW 32.4  40.1 - 02.7 %    Platelets 177  150 - 400 K/uL   BASIC METABOLIC PANEL     Status: Abnormal   Collection Time   11/09/11  4:07 AM      Component Value Range Comment   Sodium 140  135 - 145 mEq/L    Potassium 3.9  3.5 - 5.1 mEq/L    Chloride 106  96 - 112 mEq/L    CO2 26  19 - 32 mEq/L    Glucose, Bld 114 (*) 70 - 99 mg/dL    BUN 10  6 - 23 mg/dL    Creatinine, Ser 2.53  0.50 - 1.35 mg/dL    Calcium 8.6  8.4 - 66.4 mg/dL    GFR calc non Af Amer 56 (*) >90 mL/min    GFR calc Af Amer 65 (*) >90 mL/min     Radiology/Results: No results found.  Anti-infectives: Anti-infectives     Start     Dose/Rate Route Frequency Ordered Stop   11/07/11 1800   cefOXitin (MEFOXIN) 1 g in dextrose 5 % 50 mL IVPB        1 g 100 mL/hr over  30 Minutes Intravenous 4 times per day 11/07/11 1648 11/07/11 1910   11/07/11 1046   cefOXitin (MEFOXIN) 2 g in dextrose 5 % 50 mL IVPB        2 g 100 mL/hr over 30 Minutes Intravenous 60 min pre-op 11/07/11 1046 11/07/11 1326          Assessment/Plan: Problem List: Patient Active Problem List  Diagnosis  . Polyp of colon-cecal  . Rhinophyma    Path pending.  Advance to full liquids.   3 Days Post-Op    LOS: 3 days   Matt B. Daphine Deutscher, MD, [facility] [phone] beeper [phone]  11/10/2011 7:42 AM

## 2011-11-11 DIAGNOSIS — I10 Essential (primary) hypertension: Secondary | ICD-10-CM

## 2011-11-11 MED ORDER — CALCIUM CARBONATE ANTACID 500 MG PO CHEW
2.0000 | CHEWABLE_TABLET | Freq: Four times a day (QID) | ORAL | Status: DC | PRN
  Administered 2011-11-11: 400 mg via ORAL
  Filled 2011-11-11: qty 2

## 2011-11-11 MED ORDER — VITAMINS A & D EX OINT
TOPICAL_OINTMENT | CUTANEOUS | Status: AC
  Administered 2011-11-11: 16:00:00
  Filled 2011-11-11: qty 5

## 2011-11-11 NOTE — Progress Notes (Signed)
4 Days Post-Op  Subjective: Passing gas.  Tolerating full liquids.  Objective: Vital signs in last 24 hours: Temp:  [97.8 F (36.6 C)-98.3 F (36.8 C)] 97.8 F (36.6 C) (09/14 0600) Pulse Rate:  [64-74] 74  (09/14 0600) Resp:  [18] 18  (09/14 0600) BP: (150-164)/(80-95) 164/95 mmHg (09/14 0600) SpO2:  [96 %-97 %] 97 % (09/14 0600) Last BM Date: 11/10/11  Intake/Output from previous day: 09/13 0701 - 09/14 0700 In: 2628.8 [P.O.:480; I.V.:2148.8] Out: 3325 [Urine:3325] Intake/Output this shift:    PE: Abd-soft, incision clean and intact  Lab Results:   Basename 11/09/11 0407  WBC 12.8*  HGB 13.2  HCT 37.9*  PLT 177   BMET  Basename 11/09/11 0407  NA 140  K 3.9  CL 106  CO2 26  GLUCOSE 114*  BUN 10  CREATININE 1.19  CALCIUM 8.6   PT/INR No results found for this basename: LABPROT:2,INR:2 in the last 72 hours Comprehensive Metabolic Panel:    Component Value Date/Time   NA 140 11/09/2011 0407   K 3.9 11/09/2011 0407   CL 106 11/09/2011 0407   CO2 26 11/09/2011 0407   BUN 10 11/09/2011 0407   CREATININE 1.19 11/09/2011 0407   GLUCOSE 114* 11/09/2011 0407   CALCIUM 8.6 11/09/2011 0407   AST 18 11/08/2011 0311   ALT 9 11/08/2011 0311   ALKPHOS 34* 11/08/2011 0311   BILITOT 0.9 11/08/2011 0311   PROT 5.7* 11/08/2011 0311   ALBUMIN 3.0* 11/08/2011 0311     Studies/Results: No results found.  Anti-infectives: Anti-infectives     Start     Dose/Rate Route Frequency Ordered Stop   11/07/11 1800   cefOXitin (MEFOXIN) 1 g in dextrose 5 % 50 mL IVPB        1 g 100 mL/hr over 30 Minutes Intravenous 4 times per day 11/07/11 1648 11/07/11 1910   11/07/11 1046   cefOXitin (MEFOXIN) 2 g in dextrose 5 % 50 mL IVPB        2 g 100 mL/hr over 30 Minutes Intravenous 60 min pre-op 11/07/11 1046 11/07/11 1326          Assessment Principal Problem:  *Polyp of colon-cecal s/p right colectomy 11/07/11-bowel function returning.   HTN-on bystolic    LOS: 4 days   Plan:  Advance diet.   Orchid Glassberg J 11/11/2011

## 2011-11-12 MED ORDER — OXYCODONE-ACETAMINOPHEN 5-325 MG PO TABS: 1.0000 | ORAL_TABLET | ORAL | Status: AC | PRN

## 2011-11-12 NOTE — Progress Notes (Signed)
5 Days Post-Op  Subjective: Bowels moving.  Tolerating diet.  Objective: Vital signs in last 24 hours: Temp:  [98 F (36.7 C)-98.5 F (36.9 C)] 98 F (36.7 C) (09/15 0545) Pulse Rate:  [60-72] 72  (09/15 0901) Resp:  [16-18] 18  (09/15 0545) BP: (137-157)/(72-94) 137/72 mmHg (09/15 0901) SpO2:  [95 %-98 %] 95 % (09/15 0545) Last BM Date: 11/11/11  Intake/Output from previous day: 09/14 0701 - 09/15 0700 In: 1951.7 [P.O.:980; I.V.:971.7] Out: 1325 [Urine:1325] Intake/Output this shift: Total I/O In: 240 [P.O.:240] Out: 400 [Urine:400]  PE: Abd-soft, incisions clean and intact  Lab Results:  No results found for this basename: WBC:2,HGB:2,HCT:2,PLT:2 in the last 72 hours BMET No results found for this basename: NA:2,K:2,CL:2,CO2:2,GLUCOSE:2,BUN:2,CREATININE:2,CALCIUM:2 in the last 72 hours PT/INR No results found for this basename: LABPROT:2,INR:2 in the last 72 hours Comprehensive Metabolic Panel:    Component Value Date/Time   NA 140 11/09/2011 0407   K 3.9 11/09/2011 0407   CL 106 11/09/2011 0407   CO2 26 11/09/2011 0407   BUN 10 11/09/2011 0407   CREATININE 1.19 11/09/2011 0407   GLUCOSE 114* 11/09/2011 0407   CALCIUM 8.6 11/09/2011 0407   AST 18 11/08/2011 0311   ALT 9 11/08/2011 0311   ALKPHOS 34* 11/08/2011 0311   BILITOT 0.9 11/08/2011 0311   PROT 5.7* 11/08/2011 0311   ALBUMIN 3.0* 11/08/2011 0311     Studies/Results: No results found.  Anti-infectives: Anti-infectives     Start     Dose/Rate Route Frequency Ordered Stop   11/07/11 1800   cefOXitin (MEFOXIN) 1 g in dextrose 5 % 50 mL IVPB        1 g 100 mL/hr over 30 Minutes Intravenous 4 times per day 11/07/11 1648 11/07/11 1910   11/07/11 1046   cefOXitin (MEFOXIN) 2 g in dextrose 5 % 50 mL IVPB        2 g 100 mL/hr over 30 Minutes Intravenous 60 min pre-op 11/07/11 1046 11/07/11 1326          Assessment Principal Problem:  *Polyp of colon-cecum s/p right colectomy 11/07/11-doing well.  Path  pending.   HTN-on bystolic    LOS: 5 days   Plan: Discharge today.  Instructions given.   Anthony Tyler J 11/12/2011

## 2011-11-12 NOTE — Progress Notes (Signed)
Patient was instructed to call for wheelchair when wife arrived, but left with wife ambulatory without informing RN. Assessment unchanged. States understanding of discharge instructions. rx given for percocet

## 2011-11-15 ENCOUNTER — Encounter (INDEPENDENT_AMBULATORY_CARE_PROVIDER_SITE_OTHER): Payer: Self-pay | Admitting: Surgery

## 2011-11-15 ENCOUNTER — Ambulatory Visit (INDEPENDENT_AMBULATORY_CARE_PROVIDER_SITE_OTHER): Payer: Medicare Other | Admitting: Surgery

## 2011-11-15 VITALS — BP 148/76 | HR 70 | Temp 97.8°F | Resp 16 | Ht 74.0 in | Wt 203.0 lb

## 2011-11-15 DIAGNOSIS — D126 Benign neoplasm of colon, unspecified: Secondary | ICD-10-CM

## 2011-11-15 DIAGNOSIS — K635 Polyp of colon: Secondary | ICD-10-CM

## 2011-11-15 NOTE — Progress Notes (Signed)
Weber Cooks [place] 78 y.o.  Body mass index is 26.06 kg/(m^2).  Patient Active Problem List  Diagnosis  . 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 Rhinophyma    No Known Allergies  Past Surgical History  Procedure Date  . Cataract extraction 03/2011    right  . Hernia repair 2008  . Eye surgery     RETINAL SURGERY RT EYE FOR EYE HEMORRHAGE--THEN HAD CATARACT REMOVED RT EYE  . Tonsillectomy     AGE 73   DEFAULT,PROVIDER, MD 1. Benign Cecal Adenoma-Lap Asst Right Colectomy September 2013     Incision bland.  Staples out today.  Path back from [place] which did not show any cancer but much dysplasia.  Copy of path to patient.   Doing well thus far 8 days postop Matt B. Daphine Deutscher, MD, [facility] [phone] beeper [phone]  11/15/2011 11:41 AM

## 2011-11-15 NOTE — Patient Instructions (Signed)
Advance activity and diet as tolerated

## 2011-11-16 NOTE — Discharge Summary (Signed)
Physician Discharge Summary  Patient ID: Anthony Tyler MRN: [id number] DOB/AGE: Mar 29, 1930 78 y.o.  Admit date: 11/07/2011 Discharge date: 11/12/11  Admission Diagnoses:  Large cecal polyp  Discharge Diagnoses:  Dysplastic polyp of cecal  Principal Problem:  *Benign Cecal Adenoma-Lap Asst Right Colectomy September 2013   Surgery:  Right hemicolectomy  Discharged Condition: improved  Hospital Course:   Had surgery and primary anastomosis.  Diet advanced which he tolerated well.  Ready for discharge on Sunday, Sept 15. By Dr. Abbey Chatters  Consults: none  Significant Diagnostic Studies: none    Discharge Exam: Blood pressure 137/72, pulse 72, temperature 98 F (36.7 C), temperature source Oral, resp. rate 18, height 6' (1.829 m), weight 208 lb 1.8 oz (94.4 kg), SpO2 95.00%. Incision healing OK and staples were left in place  Disposition: 01-Home or Self Care  Discharge Orders    Future Appointments: Provider: Department: Dept Phone: Center:   12/13/2011 3:40 PM Valarie Merino, MD [facility] [phone] None       Medication List     As of 11/16/2011 10:09 AM    TAKE these medications         acetaminophen 500 MG tablet   Commonly known as: TYLENOL   Take 500 mg by mouth every 6 (six) hours as needed. PAIN      dorzolamide 2 % ophthalmic solution   Commonly known as: TRUSOPT   Place 1 drop into both eyes 2 (two) times daily.      nebivolol 5 MG tablet   Commonly known as: BYSTOLIC   Take 5 mg by mouth daily with breakfast.      oxyCODONE-acetaminophen 5-325 MG per tablet   Commonly known as: PERCOCET/ROXICET   Take 1-2 tablets by mouth every 4 (four) hours as needed.         SignedLuretha Murphy B 11/16/2011, 10:09 AM

## 2011-11-22 ENCOUNTER — Encounter (INDEPENDENT_AMBULATORY_CARE_PROVIDER_SITE_OTHER): Payer: Self-pay

## 2011-11-30 ENCOUNTER — Telehealth (INDEPENDENT_AMBULATORY_CARE_PROVIDER_SITE_OTHER): Payer: Self-pay

## 2011-11-30 NOTE — Telephone Encounter (Signed)
Pt called stating his last BM was Sat 9-28. Pt states he has not tried any laxatives just stool softeners. Pt advised to begin Miralax Bid with increase in fluids. Pt to eat very light until he starts to have BMs.  Pt to call if he has not had BM in 24 hours. Pt states he otherwise is feeling fine. Pt will call with any concerns.

## 2011-12-13 ENCOUNTER — Telehealth (INDEPENDENT_AMBULATORY_CARE_PROVIDER_SITE_OTHER): Payer: Self-pay | Admitting: General Surgery

## 2011-12-13 ENCOUNTER — Encounter (INDEPENDENT_AMBULATORY_CARE_PROVIDER_SITE_OTHER): Payer: Self-pay | Admitting: Surgery

## 2011-12-13 ENCOUNTER — Ambulatory Visit (INDEPENDENT_AMBULATORY_CARE_PROVIDER_SITE_OTHER): Payer: Medicare Other | Admitting: Surgery

## 2011-12-13 VITALS — BP 140/64 | HR 72 | Temp 97.4°F | Resp 16 | Ht 74.0 in | Wt 204.8 lb

## 2011-12-13 DIAGNOSIS — R5383 Other fatigue: Secondary | ICD-10-CM

## 2011-12-13 LAB — CBC
HCT: 39.8 % (ref 39.0–52.0)
Hemoglobin: 13.5 g/dL (ref 13.0–17.0)
MCH: 31.5 pg (ref 26.0–34.0)
MCHC: 33.9 g/dL (ref 30.0–36.0)
MCV: 93 fL (ref 78.0–100.0)

## 2011-12-13 NOTE — Progress Notes (Signed)
Anthony Tyler 78 y.o.  Body mass index is 26.29 kg/(m^2).  Patient Active Problem List  Diagnosis  . 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 Rhinophyma    No Known Allergies  Past Surgical History  Procedure Date  . Cataract extraction 03/2011    right  . Hernia repair 2008  . Eye surgery     RETINAL SURGERY RT EYE FOR EYE HEMORRHAGE--THEN HAD CATARACT REMOVED RT EYE  . Tonsillectomy     AGE 55   DEFAULT,PROVIDER, MD 1. Fatigue     Incision has healed nicely.  A copy of his pathology report was given to him. He had a large sessile dysplastic adenoma but no cancer. He is complaining of some fatigue which I think is probably related to the surgery. His family was concerned and were ward about his prostate since he never has had a primary care doctor. A lower PSA and a serum testosterone as well as check a CBC.  I'm am going to get him an appointment with Dr. Italy Badger to see if he can see him as her primary medical consultant. I'll see him back in 6 weeks and can review the lab at the time. Matt B. Daphine Deutscher, MD, [facility] [phone] beeper [phone]  12/13/2011 4:25 PM

## 2011-12-13 NOTE — Telephone Encounter (Signed)
LMOM letting pt know his next appt will be on 11/20 at 4:20.

## 2011-12-14 LAB — PSA: PSA: 2.48 ng/mL (ref <=4.00)

## 2011-12-14 LAB — TESTOSTERONE, FREE, TOTAL, SHBG: Testosterone: 375.1 ng/dL (ref 300–890)

## 2011-12-18 ENCOUNTER — Telehealth (INDEPENDENT_AMBULATORY_CARE_PROVIDER_SITE_OTHER): Payer: Self-pay | Admitting: General Surgery

## 2011-12-18 ENCOUNTER — Telehealth (INDEPENDENT_AMBULATORY_CARE_PROVIDER_SITE_OTHER): Payer: Self-pay

## 2011-12-18 NOTE — Telephone Encounter (Signed)
The pt would like to speak to [facility]  He is Dr Ermalene Searing pt

## 2011-12-18 NOTE — Telephone Encounter (Signed)
Spoke with pt and he explained that he has not had a bowel movement since last Monday.  I asked him what he has been using to help with this and he said he has been eating prunes like Dr. Daphine Deutscher suggested.  I advised him to take MiraLax as directed and titrate it until he can get to one soft bowel movement a day.  I informed him that I would also run this by Dr. Daphine Deutscher and see if he has any other suggestions for him.  He said this would be fine and that he would start back on the MiraLax.

## 2012-01-17 ENCOUNTER — Ambulatory Visit (INDEPENDENT_AMBULATORY_CARE_PROVIDER_SITE_OTHER): Payer: Medicare Other | Admitting: Surgery

## 2012-01-17 ENCOUNTER — Encounter (INDEPENDENT_AMBULATORY_CARE_PROVIDER_SITE_OTHER): Payer: Self-pay | Admitting: Surgery

## 2012-01-17 VITALS — BP 192/100 | HR 76 | Temp 97.8°F | Resp 20 | Ht 74.0 in | Wt 205.4 lb

## 2012-01-17 DIAGNOSIS — K635 Polyp of colon: Secondary | ICD-10-CM

## 2012-01-17 DIAGNOSIS — D126 Benign neoplasm of colon, unspecified: Secondary | ICD-10-CM

## 2012-01-17 MED ORDER — OXYCODONE-ACETAMINOPHEN 5-325 MG PO TABS: 1.0000 | ORAL_TABLET | ORAL | Status: DC | PRN

## 2012-01-17 NOTE — Progress Notes (Signed)
LARGE SESSILE ADENOMA WITH ALL GRADES OF DYSPLASIA. - ADDITIONAL ADENOMA AND SESSILE SERRATED ADENOMA IN THE ASCENDING COLON. - ENCAPSULATED FAT NECROSIS IN PERICOLIC ADIPOSE TISSUE. - ELEVEN LYMPH NODES, NEGATIVE FOR MALIGNANCY (0/11) - RESECTION MARGINS NEGATIVE FOR ATYPIA OR MALIGNANCY.  Doing well after right hemicolectomy.  Incision OK.  Needs a few more pain meds and 1 script provided.  Oxycodone.  Copy of labs provided including PSA, testosterone, etc.  Low normal.   Return prn

## 2012-01-17 NOTE — Patient Instructions (Signed)
LARGE SESSILE ADENOMA WITH ALL GRADES OF DYSPLASIA. - ADDITIONAL ADENOMA AND SESSILE SERRATED ADENOMA IN THE ASCENDING COLON. - ENCAPSULATED FAT NECROSIS IN PERICOLIC ADIPOSE TISSUE. - ELEVEN LYMPH NODES, NEGATIVE FOR MALIGNANCY (0/11) - RESECTION MARGINS NEGATIVE FOR ATYPIA OR MALIGNANCY.  Thanks for your patience.  If you need further assistance after leaving the office, please call our office and speak with a [facility] nurse.  [phone].  If you want to leave a message for Dr. Daphine Deutscher, please call his office phone at [phone].

## 2012-04-16 ENCOUNTER — Encounter (INDEPENDENT_AMBULATORY_CARE_PROVIDER_SITE_OTHER): Payer: Self-pay

## 2012-11-17 ENCOUNTER — Emergency Department (HOSPITAL_COMMUNITY): Payer: Medicare Other

## 2012-11-17 ENCOUNTER — Emergency Department (HOSPITAL_COMMUNITY)
Admission: EM | Admit: 2012-11-17 | Discharge: 2012-11-17 | Disposition: A | Discharge status: Home / Self Care | Payer: Medicare Other | Attending: Emergency Medicine | Admitting: Emergency Medicine

## 2012-11-17 ENCOUNTER — Encounter (HOSPITAL_COMMUNITY): Payer: Self-pay | Admitting: Radiology

## 2012-11-17 DIAGNOSIS — Z8669 Personal history of other diseases of the nervous system and sense organs: Secondary | ICD-10-CM | POA: Insufficient documentation

## 2012-11-17 DIAGNOSIS — Z87891 Personal history of nicotine dependence: Secondary | ICD-10-CM | POA: Insufficient documentation

## 2012-11-17 DIAGNOSIS — M545 Low back pain, unspecified: Secondary | ICD-10-CM | POA: Insufficient documentation

## 2012-11-17 DIAGNOSIS — I10 Essential (primary) hypertension: Secondary | ICD-10-CM | POA: Insufficient documentation

## 2012-11-17 DIAGNOSIS — Z860100 Personal history of colon polyps, unspecified: Secondary | ICD-10-CM | POA: Insufficient documentation

## 2012-11-17 DIAGNOSIS — G8929 Other chronic pain: Secondary | ICD-10-CM | POA: Insufficient documentation

## 2012-11-17 DIAGNOSIS — Z8619 Personal history of other infectious and parasitic diseases: Secondary | ICD-10-CM | POA: Insufficient documentation

## 2012-11-17 DIAGNOSIS — Z79899 Other long term (current) drug therapy: Secondary | ICD-10-CM | POA: Insufficient documentation

## 2012-11-17 DIAGNOSIS — R1031 Right lower quadrant pain: Secondary | ICD-10-CM | POA: Insufficient documentation

## 2012-11-17 LAB — URINALYSIS, ROUTINE W REFLEX MICROSCOPIC
Bilirubin Urine: NEGATIVE
Hgb urine dipstick: NEGATIVE
Nitrite: NEGATIVE
Protein, ur: NEGATIVE mg/dL
Specific Gravity, Urine: 1.017 (ref 1.005–1.030)
pH: 6 (ref 5.0–8.0)

## 2012-11-17 LAB — CBC WITH DIFFERENTIAL/PLATELET
Basophils Absolute: 0 10*3/uL (ref 0.0–0.1)
Basophils Relative: 0 % (ref 0–1)
Eosinophils Absolute: 0 10*3/uL (ref 0.0–0.7)
Eosinophils Relative: 0 % (ref 0–5)
HCT: 40.1 % (ref 39.0–52.0)
Hemoglobin: 14 g/dL (ref 13.0–17.0)
Lymphocytes Relative: 17 % (ref 12–46)
Lymphs Abs: 3.1 10*3/uL (ref 0.7–4.0)
MCH: 31.7 pg (ref 26.0–34.0)
MCHC: 34.9 g/dL (ref 30.0–36.0)
MCV: 90.7 fL (ref 78.0–100.0)
Monocytes Absolute: 1.1 10*3/uL — ABNORMAL HIGH (ref 0.1–1.0)
Monocytes Relative: 6 % (ref 3–12)
Neutro Abs: 14.3 10*3/uL — ABNORMAL HIGH (ref 1.7–7.7)
Neutrophils Relative %: 77 % (ref 43–77)
Platelets: 227 10*3/uL (ref 150–400)
RBC: 4.42 MIL/uL (ref 4.22–5.81)
RDW: 13.2 % (ref 11.5–15.5)
WBC: 18.5 10*3/uL — ABNORMAL HIGH (ref 4.0–10.5)

## 2012-11-17 LAB — COMPREHENSIVE METABOLIC PANEL
ALT: 16 U/L (ref 0–53)
AST: 22 U/L (ref 0–37)
Albumin: 3.9 g/dL (ref 3.5–5.2)
Calcium: 9.6 mg/dL (ref 8.4–10.5)
GFR calc Af Amer: 60 mL/min — ABNORMAL LOW (ref >90)
Sodium: 133 mEq/L — ABNORMAL LOW (ref 135–145)
Total Bilirubin: 0.6 mg/dL (ref 0.3–1.2)
Total Protein: 7.3 g/dL (ref 6.0–8.3)

## 2012-11-17 MED ORDER — MORPHINE SULFATE 4 MG/ML IJ SOLN
4.0000 mg | Freq: Once | INTRAMUSCULAR | Status: AC
  Administered 2012-11-17: 4 mg via INTRAVENOUS
  Filled 2012-11-17: qty 1

## 2012-11-17 MED ORDER — IOHEXOL 300 MG/ML  SOLN
100.0000 mL | Freq: Once | INTRAMUSCULAR | Status: AC | PRN
  Administered 2012-11-17: 100 mL via INTRAVENOUS

## 2012-11-17 MED ORDER — SODIUM CHLORIDE 0.9 % IV BOLUS (SEPSIS)
1000.0000 mL | Freq: Once | INTRAVENOUS | Status: AC
  Administered 2012-11-17: 1000 mL via INTRAVENOUS

## 2012-11-17 MED ORDER — IOHEXOL 300 MG/ML  SOLN
25.0000 mL | INTRAMUSCULAR | Status: DC | PRN
  Administered 2012-11-17: 25 mL via ORAL

## 2012-11-17 MED ORDER — NEBIVOLOL HCL 5 MG PO TABS
5.0000 mg | ORAL_TABLET | Freq: Once | ORAL | Status: AC
  Administered 2012-11-17: 5 mg via ORAL
  Filled 2012-11-17: qty 1

## 2012-11-17 MED ORDER — OXYCODONE-ACETAMINOPHEN 5-325 MG PO TABS: 2.0000 | ORAL_TABLET | ORAL | Status: DC | PRN

## 2012-11-17 NOTE — ED Provider Notes (Signed)
CSN: [id number]     Arrival date & time 11/17/12  7829 History   First MD Initiated Contact with Patient 11/17/12 [phone]     No chief complaint on file.  (Consider location/radiation/quality/duration/timing/severity/associated sxs/prior Treatment) HPI Comments: chronic lumbar pain w/ recently diagnosed spinal stenosis by MRI, now w/ lower abdominal pain since last night, worse in right side w/ assoicated nausea, vomiting.  No fever, urinary symptoms, d/a. He has chronic unchanged decreased sensation from "top of my head to tips of my toes" for several months. He had a lumab injection on 9/18 and state back pain worse since then.   Patient is a 79 y.o. male presenting with abdominal pain. The history is provided by the patient. No language interpreter was used.  Abdominal Pain Pain location:  Suprapubic and RLQ Pain quality: aching   Pain radiates to:  Does not radiate Pain severity:  Moderate Onset quality:  Unable to specify Duration:  1 day Timing:  Constant Progression:  Unchanged Chronicity:  New Relieved by:  Nothing Worsened by:  Nothing tried Ineffective treatments: oxycodone. Associated symptoms: nausea and vomiting   Associated symptoms: no chest pain, no constipation, no cough, no diarrhea, no dysuria, no fatigue, no fever, no hematuria and no shortness of breath   Risk factors: being elderly     Past Medical History  Diagnosis Date  . Hypertension   . Glaucoma     BOTH EYES  . Cataract     LEFT EYE  . Partial blindness     RIGHT EYE  . Polyp of colon     DYSPLASIA IN A CECAL  POLYP - FOUND ON COLONOSCOPY  . Enlargement of the nose     OUTER NOSE VERY ENLARGED--STATES HE SOMETIMES MASHES BUMPS ON THE NOSE AND BLOOD WILL COME OUT OF BUMPS.  PT DOES NOT HAVE PCP--SAW A DERMATOLOGIST ONCE--BUT STOPPED THE "PILLS" HE WAS GIVEN-DIDN'T LIKE THE WAY THE MED MADE HIM FEEL  . Hoarse     ON GOING FOR A WHILE  . Warts     ON RIGHT EAR--STATES HE WAS BORN WITH THE WARTS  .  Pain     LOWER BACK WITH PROLONGED STANDING  . Sleep apnea 11/03/11    STOP BANG SCORE OF 5   Past Surgical History  Procedure Laterality Date  . Cataract extraction  03/2011    right  . Hernia repair  2008  . Eye surgery      RETINAL SURGERY RT EYE FOR EYE HEMORRHAGE--THEN HAD CATARACT REMOVED RT EYE  . Tonsillectomy      AGE 43  . Hemicolectomy     Family History  Problem Relation Age of Onset  . Stroke Mother    History  Substance Use Topics  . Smoking status: Former Smoker    Quit date: 10/25/1947  . Smokeless tobacco: Never Used  . Alcohol Use: No    Review of Systems  Constitutional: Negative for fever, activity change, appetite change and fatigue.  HENT: Negative for congestion, facial swelling, rhinorrhea and trouble swallowing.   Eyes: Negative for photophobia and pain.  Respiratory: Negative for cough, chest tightness and shortness of breath.   Cardiovascular: Negative for chest pain and leg swelling.  Gastrointestinal: Positive for nausea, vomiting and abdominal pain. Negative for diarrhea and constipation.  Endocrine: Negative for polydipsia and polyuria.  Genitourinary: Negative for dysuria, urgency, hematuria, decreased urine volume and difficulty urinating.  Musculoskeletal: Positive for back pain. Negative for gait problem.  Skin: Negative for color  change, rash and wound.  Allergic/Immunologic: Negative for immunocompromised state.  Neurological: Negative for dizziness, facial asymmetry, speech difficulty, weakness, numbness and headaches.  Psychiatric/Behavioral: Negative for confusion, decreased concentration and agitation.    Allergies  Review of patient's allergies indicates no known allergies.  Home Medications   Current Outpatient Rx  Name  Route  Sig  Dispense  Refill  . acetaminophen (TYLENOL) 500 MG tablet   Oral   Take 500 mg by mouth every 6 (six) hours as needed for pain. PAIN         . amLODipine (NORVASC) 5 MG tablet   Oral   Take  5 mg by mouth daily.          . nebivolol (BYSTOLIC) 5 MG tablet   Oral   Take 5 mg by mouth daily with breakfast.          . oxyCODONE-acetaminophen (PERCOCET/ROXICET) 5-325 MG per tablet   Oral   Take 1 tablet by mouth every 4 (four) hours as needed for pain.         Marland Kitchen oxyCODONE-acetaminophen (PERCOCET) 5-325 MG per tablet   Oral   Take 2 tablets by mouth every 4 (four) hours as needed for pain.   15 tablet   0    BP 143/71  Pulse 73  Temp(Src) 98.4 F (36.9 C) (Oral)  Resp 18  Ht 6\' 2"  (1.88 m)  Wt 190 lb (86.183 kg)  BMI 24.38 kg/m2  SpO2 99% Physical Exam  Constitutional: He is oriented to person, place, and time. He appears well-developed and well-nourished. No distress.  HENT:  Head: Normocephalic and atraumatic.  Mouth/Throat: No oropharyngeal exudate.  Eyes: Pupils are equal, round, and reactive to light.  Neck: Normal range of motion. Neck supple.  Cardiovascular: Normal rate, regular rhythm and normal heart sounds.  Exam reveals no gallop and no friction rub.   No murmur heard. Pulmonary/Chest: Effort normal and breath sounds normal. No respiratory distress. He has no wheezes. He has no rales.  Abdominal: Soft. Bowel sounds are normal. He exhibits no distension and no mass. There is no tenderness. There is no rebound and no guarding.  Musculoskeletal: Normal range of motion. He exhibits no edema and no tenderness.  Neurological: He is alert and oriented to person, place, and time. A sensory deficit is present. No cranial nerve deficit. He exhibits normal muscle tone. GCS eye subscore is 4. GCS verbal subscore is 5. GCS motor subscore is 6.  Generalized decreased sensation  Skin: Skin is warm and dry.  Psychiatric: He has a normal mood and affect.    ED Course  Procedures (including critical care time) Labs Review Labs Reviewed  CBC WITH DIFFERENTIAL - Abnormal; Notable for the following:    WBC 18.5 (*)    Neutro Abs 14.3 (*)    Monocytes Absolute  1.1 (*)    All other components within normal limits  COMPREHENSIVE METABOLIC PANEL - Abnormal; Notable for the following:    Sodium 133 (*)    Glucose, Bld 106 (*)    BUN 29 (*)    GFR calc non Af Amer 52 (*)    GFR calc Af Amer 60 (*)    All other components within normal limits  URINE CULTURE  URINALYSIS, ROUTINE W REFLEX MICROSCOPIC  LACTIC ACID, PLASMA   Imaging Review Ct Abdomen Pelvis W Contrast  11/17/2012   CLINICAL DATA:  Bloating. Nausea and dry heaves  EXAM: CT ABDOMEN AND PELVIS WITH CONTRAST  TECHNIQUE:  Multidetector CT imaging of the abdomen and pelvis was performed using the standard protocol following bolus administration of intravenous contrast.  CONTRAST:  OMNIPAQUE IOHEXOL 300 MG/ML SOLN, 25mL OMNIPAQUE IOHEXOL 300 MG/ML SOLN  COMPARISON:  10/29/2006  FINDINGS: There is a small nodule within the right middle lobe measuring 4 mm. Also on the right middle lobe is a 4 mm nodule, image number 1/ series 3. There is no focal liver abnormality identified. The gallbladder appears normal. No biliary dilatation. Normal appearance of the pancreas. The spleen is unremarkable. The adrenal glands are both normal. Bilateral renal cysts are again noted peer cyst arising from the upper pole of the right kidney now measures 11.2 cm. This is increased from 8.9 cm previously peer cyst in the inferior pole of the left kidney measures 7.1 cm, image 51/ series 2. This is compared with 5.1 cm previously. The urinary bladder is normal. Mild prostate gland enlargement containing central areas of calcification.  Adrenal glands are both normal stress set the abdominal aorta has a normal caliber. There is no adenopathy. No pelvic or inguinal adenopathy is identified.  The stomach is within normal limits. The small bowel loops have a normal course and caliber without obstruction. The patient is status post right hemicolectomy with intra colonic anastomosis. The colon appears within normal limits. Stress  set the mid and distal colon appear normal.  No free fluid or abnormal fluid collection identified within the abdomen or pelvis. Review of the visualized osseous structures is significant for multilevel lumbar degenerative disc disease. There is a 1st degree anterolisthesis of L4 on L5. Chronic appearing compression deformity involving stress set stable compression deformity involves the L1 vertebra.  IMPRESSION: 1.  No acute findings within the abdomen or pelvis.  2. Bilateral renal cysts have increased in size from 10/29/2006.  3. Previous right hemicolectomy with intra colonic anastomosis. No complications identified. There is no evidence for bowel obstruction.  4. Stable appearance of L1 compression fracture.  5. Small nonspecific pulmonary nodules in the right base are new from previous exam measuring up to 4 mm. If the patient is at high risk for bronchogenic carcinoma, follow-up chest CT at 1year is recommended. If the patient is at low risk, no follow-up is needed. This recommendation follows the consensus statement: Guidelines for Management of Small Pulmonary Nodules Detected on CT Scans: A Statement from the Fleischner Society as published in Radiology 2005; 237:395-400.   Electronically Signed   By: Signa Kell M.D.   On: 11/17/2012 13:40     Date: 11/17/2012  Rate: 72  Rhythm: Sinus with first degree AV block  QRS Axis: LAD  Intervals: prolonged PR  ST/T Wave abnormalities: nonspecific T wave changes, TW flattening aVL  Conduction Disutrbances:none  Narrative Interpretation:   Old EKG Reviewed: unchanged    MDM   1. Low back pain   2. Lower abdominal pain, unspecified laterality    Pt is a 79 y.o. male with Pmhx as above who presents with chronic lumbar pain w/ recently diagnosed spinal stenosis by MRI, now w/ lower abdominal pain since last night, worse in right side w/ assoicated nausea, vomiting.  He thinks it may be from his back pain. No fever, urinary symptoms, d/a. He has  chronic unchanged decreased sensation from "top of my head to tips of my toes" for several months. CT ab/pelvis ordered.  Pt has WBC count, but hx of elevation in past, CMP unremarkable except for Na 133, BUN 29.  Cr stable.  LA not elevated.   3:19 PM pt's ab pain now resolved after 1 dose IV morphine, now smiling.  CT ab/pelvis unremarkable for acute cause of ab pain.  L1 compression fracture unchanged.  Renal nodule & kidney cysts seen. Pt can f/u with PCP for these incidental findings.  I believe he is safe for d/c home. Return precautions given for new or worsening symptoms.  He will call his PCP, Dr. Clovis Riley for f/u this week.   1. Low back pain   2. Lower abdominal pain, unspecified laterality         Shanna Cisco, MD 11/17/12 1549

## 2012-11-17 NOTE — ED Notes (Signed)
Pt remains in ct at this time

## 2012-11-17 NOTE — ED Notes (Signed)
Pt knows that urine is needed 

## 2012-11-17 NOTE — ED Notes (Signed)
Pt presents with lower nack pain X 2008 and lower abd paoin X 1 day.

## 2012-11-19 LAB — URINE CULTURE
Colony Count: NO GROWTH
Culture: NO GROWTH

## 2013-01-17 ENCOUNTER — Ambulatory Visit (INDEPENDENT_AMBULATORY_CARE_PROVIDER_SITE_OTHER): Payer: Medicare Other | Admitting: Surgery

## 2013-01-17 ENCOUNTER — Encounter (INDEPENDENT_AMBULATORY_CARE_PROVIDER_SITE_OTHER): Payer: Self-pay | Admitting: Surgery

## 2013-01-17 ENCOUNTER — Encounter (INDEPENDENT_AMBULATORY_CARE_PROVIDER_SITE_OTHER): Payer: Self-pay

## 2013-01-17 VITALS — BP 146/90 | HR 80 | Temp 98.9°F | Resp 15 | Ht 74.0 in | Wt 186.6 lb

## 2013-01-17 DIAGNOSIS — D126 Benign neoplasm of colon, unspecified: Secondary | ICD-10-CM

## 2013-01-17 DIAGNOSIS — K635 Polyp of colon: Secondary | ICD-10-CM

## 2013-01-17 NOTE — Progress Notes (Signed)
[facility] Progress Note:   @ORDAYSPST @  Subjective: Mental status is clear but he is repetitive and at times confused Objective: Vital signs in last 24 hours: @VSRANGES @  Intake/Output from previous day:   Intake/Output this shift: @IOTHISSHIFT @  Physical Exam: Work of breathing is normal.  Abdomen shows no incisional hernia where I removed his right colon  Lab Results:  No results found for this or any previous visit (from the past 48 hour(s)).  Radiology/Results: No results found.  Anti-infectives: Anti-infectives   None      Assessment/Plan: Problem List: Patient Active Problem List   Diagnosis Date Noted  . Benign Cecal Adenoma-Lap Asst Right Colectomy September 2013 11/02/2011  . Rhinophyma 11/02/2011    I went over the results of his recent CT scan which was done after he had severe pain following a back injection 4 spinal stenosis. He was unable to reach Dr. Newell Coral andwent to the ER where he had a workup did show some slightly elevated white count but a normal CT scan except for some nodules noted in his lower lung fields. The family is apparently discussed this with Dr. Clovis Riley. Mildly suggestion was that perhaps a chest x-ray in 3 months might be a bad idea.  Since his abdomen is okay virtually nothing I have to offer him. I did spend some time listening to his issues and some of those are related to his age and his spinal stenosis. I encouraged him to go see Dr. Evette Cristal about having his followup colonoscopy.  Return when necessary @ORDAYSPST @   @RRHLOS @  Matt B. Daphine Deutscher, MD, [facility] [phone] beeper [phone]  01/17/2013 3:04 PM

## 2013-01-17 NOTE — Patient Instructions (Signed)
You may need a chest xray in 3 months after the CT scan done recently See Dr. Evette Cristal about having your followup colonoscopy

## 2013-06-10 ENCOUNTER — Emergency Department (HOSPITAL_COMMUNITY)
Admission: EM | Admit: 2013-06-10 | Discharge: 2013-06-10 | Disposition: A | Discharge status: Home / Self Care | Payer: Medicare HMO | Attending: Emergency Medicine | Admitting: Emergency Medicine

## 2013-06-10 ENCOUNTER — Encounter (HOSPITAL_COMMUNITY): Payer: Self-pay | Admitting: Emergency Medicine

## 2013-06-10 ENCOUNTER — Emergency Department (HOSPITAL_COMMUNITY): Payer: Medicare HMO

## 2013-06-10 DIAGNOSIS — Z860100 Personal history of colon polyps, unspecified: Secondary | ICD-10-CM | POA: Insufficient documentation

## 2013-06-10 DIAGNOSIS — Z9889 Other specified postprocedural states: Secondary | ICD-10-CM | POA: Insufficient documentation

## 2013-06-10 DIAGNOSIS — Z87891 Personal history of nicotine dependence: Secondary | ICD-10-CM | POA: Insufficient documentation

## 2013-06-10 DIAGNOSIS — H547 Unspecified visual loss: Secondary | ICD-10-CM | POA: Insufficient documentation

## 2013-06-10 DIAGNOSIS — I10 Essential (primary) hypertension: Secondary | ICD-10-CM | POA: Insufficient documentation

## 2013-06-10 DIAGNOSIS — Z79899 Other long term (current) drug therapy: Secondary | ICD-10-CM | POA: Insufficient documentation

## 2013-06-10 DIAGNOSIS — Z792 Long term (current) use of antibiotics: Secondary | ICD-10-CM | POA: Insufficient documentation

## 2013-06-10 DIAGNOSIS — K529 Noninfective gastroenteritis and colitis, unspecified: Secondary | ICD-10-CM

## 2013-06-10 DIAGNOSIS — Z8619 Personal history of other infectious and parasitic diseases: Secondary | ICD-10-CM | POA: Insufficient documentation

## 2013-06-10 DIAGNOSIS — K5900 Constipation, unspecified: Secondary | ICD-10-CM | POA: Insufficient documentation

## 2013-06-10 DIAGNOSIS — K5289 Other specified noninfective gastroenteritis and colitis: Secondary | ICD-10-CM | POA: Insufficient documentation

## 2013-06-10 LAB — COMPREHENSIVE METABOLIC PANEL
ALBUMIN: 3.7 g/dL (ref 3.5–5.2)
ALK PHOS: 49 U/L (ref 39–117)
ALT: 14 U/L (ref 0–53)
AST: 29 U/L (ref 0–37)
BILIRUBIN TOTAL: 1.1 mg/dL (ref 0.3–1.2)
BUN: 26 mg/dL — ABNORMAL HIGH (ref 6–23)
CO2: 23 meq/L (ref 19–32)
Calcium: 9.2 mg/dL (ref 8.4–10.5)
Chloride: 103 mEq/L (ref 96–112)
Creatinine, Ser: 1.36 mg/dL — ABNORMAL HIGH (ref 0.50–1.35)
GFR calc Af Amer: 55 mL/min — ABNORMAL LOW (ref >90)
GFR, EST NON AFRICAN AMERICAN: 47 mL/min — AB (ref >90)
Glucose, Bld: 131 mg/dL — ABNORMAL HIGH (ref 70–99)
POTASSIUM: 4.4 meq/L (ref 3.7–5.3)
Sodium: 140 mEq/L (ref 137–147)
Total Protein: 6.8 g/dL (ref 6.0–8.3)

## 2013-06-10 LAB — CBC
HEMATOCRIT: 40.8 % (ref 39.0–52.0)
Hemoglobin: 14.5 g/dL (ref 13.0–17.0)
MCH: 33 pg (ref 26.0–34.0)
MCHC: 35.5 g/dL (ref 30.0–36.0)
MCV: 92.9 fL (ref 78.0–100.0)
PLATELETS: 163 10*3/uL (ref 150–400)
RBC: 4.39 MIL/uL (ref 4.22–5.81)
RDW: 13.3 % (ref 11.5–15.5)
WBC: 16.8 10*3/uL — ABNORMAL HIGH (ref 4.0–10.5)

## 2013-06-10 MED ORDER — METRONIDAZOLE 500 MG PO TABS: 500.0000 mg | ORAL_TABLET | Freq: Three times a day (TID) | ORAL | Status: DC

## 2013-06-10 MED ORDER — IOHEXOL 300 MG/ML  SOLN
80.0000 mL | Freq: Once | INTRAMUSCULAR | Status: AC | PRN
  Administered 2013-06-10: 80 mL via INTRAVENOUS

## 2013-06-10 MED ORDER — POLYETHYLENE GLYCOL 3350 17 G PO PACK: 17.0000 g | PACK | Freq: Every day | ORAL | Status: DC

## 2013-06-10 MED ORDER — MAGNESIUM CITRATE PO SOLN: 1.0000 | Freq: Once | ORAL | Status: DC

## 2013-06-10 MED ORDER — SODIUM CHLORIDE 0.9 % IV BOLUS (SEPSIS)
500.0000 mL | Freq: Once | INTRAVENOUS | Status: AC
  Administered 2013-06-10: 500 mL via INTRAVENOUS

## 2013-06-10 MED ORDER — CIPROFLOXACIN HCL 500 MG PO TABS: 500.0000 mg | ORAL_TABLET | Freq: Two times a day (BID) | ORAL | Status: DC

## 2013-06-10 NOTE — ED Notes (Signed)
MD at bedside. 

## 2013-06-10 NOTE — ED Notes (Signed)
Pt reports numbness to legs since this am.  Pulses present

## 2013-06-10 NOTE — Discharge Instructions (Signed)
Colitis Colitis is inflammation of the colon. Colitis can be a short-term or long-standing (chronic) illness. Crohn's disease and ulcerative colitis are 2 types of colitis which are chronic. They usually require lifelong treatment. CAUSES  There are many different causes of colitis, including:  Viruses.  Germs (bacteria).  Medicine reactions. SYMPTOMS   Diarrhea.  Intestinal bleeding.  Pain.  Fever.  Throwing up (vomiting).  Tiredness (fatigue).  Weight loss.  Bowel blockage. DIAGNOSIS  The diagnosis of colitis is based on examination and stool or blood tests. X-rays, CT scan, and colonoscopy may also be needed. TREATMENT  Treatment may include:  Fluids given through the vein (intravenously).  Bowel rest (nothing to eat or drink for a period of time).  Medicine for pain and diarrhea.  Medicines (antibiotics) that kill germs.  Cortisone medicines.  Surgery. HOME CARE INSTRUCTIONS   Get plenty of rest.  Drink enough water and fluids to keep your urine clear or pale yellow.  Eat a well-balanced diet.  Call your caregiver for follow-up as recommended. SEEK IMMEDIATE MEDICAL CARE IF:   You develop chills.  You have an oral temperature above 102 F (38.9 C), not controlled by medicine.  You have extreme weakness, fainting, or dehydration.  You have repeated vomiting.  You develop severe belly (abdominal) pain or are passing bloody or tarry stools. MAKE SURE YOU:   Understand these instructions.  Will watch your condition.  Will get help right away if you are not doing well or get worse. Document Released: 03/23/2004 Document Revised: 05/08/2011 Document Reviewed: 06/18/2009 [facility] Patient Information 2014 [place], [place].  Constipation, Adult Constipation is when a person has fewer than 3 bowel movements a week; has difficulty having a bowel movement; or has stools that are dry, hard, or larger than normal. As people grow older, constipation is  more common. If you try to fix constipation with medicines that make you have a bowel movement (laxatives), the problem may get worse. Long-term laxative use may cause the muscles of the colon to become weak. A low-fiber diet, not taking in enough fluids, and taking certain medicines may make constipation worse. CAUSES   Certain medicines, such as antidepressants, pain medicine, iron supplements, antacids, and water pills.   Certain diseases, such as diabetes, irritable bowel syndrome (IBS), thyroid disease, or depression.   Not drinking enough water.   Not eating enough fiber-rich foods.   Stress or travel.  Lack of physical activity or exercise.  Not going to the restroom when there is the urge to have a bowel movement.  Ignoring the urge to have a bowel movement.  Using laxatives too much. SYMPTOMS   Having fewer than 3 bowel movements a week.   Straining to have a bowel movement.   Having hard, dry, or larger than normal stools.   Feeling full or bloated.   Pain in the lower abdomen.  Not feeling relief after having a bowel movement. DIAGNOSIS  Your caregiver will take a medical history and perform a physical exam. Further testing may be done for severe constipation. Some tests may include:   A barium enema X-ray to examine your rectum, colon, and sometimes, your small intestine.  A sigmoidoscopy to examine your lower colon.  A colonoscopy to examine your entire colon. TREATMENT  Treatment will depend on the severity of your constipation and what is causing it. Some dietary treatments include drinking more fluids and eating more fiber-rich foods. Lifestyle treatments may include regular exercise. If these diet and lifestyle recommendations  do not help, your caregiver may recommend taking over-the-counter laxative medicines to help you have bowel movements. Prescription medicines may be prescribed if over-the-counter medicines do not work.  HOME CARE INSTRUCTIONS    Increase dietary fiber in your diet, such as fruits, vegetables, whole grains, and beans. Limit high-fat and processed sugars in your diet, such as [place] fries, hamburgers, cookies, candies, and soda.   A fiber supplement may be added to your diet if you cannot get enough fiber from foods.   Drink enough fluids to keep your urine clear or pale yellow.   Exercise regularly or as directed by your caregiver.   Go to the restroom when you have the urge to go. Do not hold it.  Only take medicines as directed by your caregiver. Do not take other medicines for constipation without talking to your caregiver first. [place] IF:   You have bright red blood in your stool.   Your constipation lasts for more than 4 days or gets worse.   You have abdominal or rectal pain.   You have thin, pencil-like stools.  You have unexplained weight loss. MAKE SURE YOU:   Understand these instructions.  Will watch your condition.  Will get help right away if you are not doing well or get worse. Document Released: 11/12/2003 Document Revised: 05/08/2011 Document Reviewed: 11/25/2012 [facility] Patient Information 2014 [place], [place].

## 2013-06-10 NOTE — ED Notes (Signed)
Pt state no bm in 4 days and states his bowels are locked up.  PT states still passing some flatus.  Pt has tried over the counter medications.  Pt had colon mass in 2013.  Pt reports some rectal bleeding.

## 2013-06-10 NOTE — ED Provider Notes (Signed)
CSN: [id number]     Arrival date & time 06/10/13  1743 History   First MD Initiated Contact with Patient 06/10/13 2007     Chief Complaint  Patient presents with  . Rectal Pain  . Constipation     (Consider location/radiation/quality/duration/timing/severity/associated sxs/prior Treatment) Patient is a 80 y.o. male presenting with constipation. The history is provided by the patient.  Constipation Severity:  Moderate Associated symptoms: no abdominal pain, no back pain, no diarrhea, no nausea and no vomiting    patient with constipation lower bowel pain. Does not evolve over the last 5 days. Patient always has some constipation since resection of part of his colon. No fevers. He states he is beginning to have some blood with the stool. He states he did pass a little stool while in ER. No nausea or vomiting. The pain is somewhat crampy.  Past Medical History  Diagnosis Date  . Hypertension   . Glaucoma     BOTH EYES  . Cataract     LEFT EYE  . Partial blindness     RIGHT EYE  . Polyp of colon     DYSPLASIA IN A CECAL  POLYP - FOUND ON COLONOSCOPY  . Enlargement of the nose     OUTER NOSE VERY ENLARGED--STATES HE SOMETIMES MASHES BUMPS ON THE NOSE AND BLOOD WILL COME OUT OF BUMPS.  PT DOES NOT HAVE PCP--SAW A DERMATOLOGIST ONCE--BUT STOPPED THE "PILLS" HE WAS GIVEN-DIDN'T LIKE THE WAY THE MED MADE HIM FEEL  . Hoarse     ON GOING FOR A WHILE  . Warts     ON RIGHT EAR--STATES HE WAS BORN WITH THE WARTS  . Pain     LOWER BACK WITH PROLONGED STANDING  . Sleep apnea 11/03/11    STOP BANG SCORE OF 5   Past Surgical History  Procedure Laterality Date  . Cataract extraction  03/2011    right  . Hernia repair  2008  . Eye surgery      RETINAL SURGERY RT EYE FOR EYE HEMORRHAGE--THEN HAD CATARACT REMOVED RT EYE  . Tonsillectomy      AGE 15  . Hemicolectomy     Family History  Problem Relation Age of Onset  . Stroke Mother    History  Substance Use Topics  . Smoking status:  Former Smoker    Quit date: 10/25/1947  . Smokeless tobacco: Never Used  . Alcohol Use: No    Review of Systems  Constitutional: Negative for activity change and appetite change.  Eyes: Negative for pain.  Respiratory: Negative for chest tightness and shortness of breath.   Cardiovascular: Negative for chest pain and leg swelling.  Gastrointestinal: Positive for constipation and blood in stool. Negative for nausea, vomiting, abdominal pain and diarrhea.  Genitourinary: Negative for flank pain.  Musculoskeletal: Negative for back pain and neck stiffness.  Skin: Negative for rash.  Neurological: Negative for weakness, numbness and headaches.  Psychiatric/Behavioral: Negative for behavioral problems.      Allergies  Review of patient's allergies indicates no known allergies.  Home Medications   Prior to Admission medications   Medication Sig Start Date End Date Taking? Authorizing Provider  acetaminophen (TYLENOL) 500 MG tablet Take 500 mg by mouth every 6 (six) hours as needed for pain. PAIN   Yes Historical Provider, MD  amLODipine (NORVASC) 5 MG tablet Take 5 mg by mouth daily.  12/11/11  Yes Historical Provider, MD  ciprofloxacin (CIPRO) 500 MG tablet Take 1 tablet (500 mg total)  by mouth 2 (two) times daily. 06/10/13   Jasper Riling. Kanai Hilger, MD  magnesium citrate SOLN Take 296 mLs (1 Bottle total) by mouth once. 06/10/13   Jasper Riling. Yosselyn Tax, MD  metroNIDAZOLE (FLAGYL) 500 MG tablet Take 1 tablet (500 mg total) by mouth 3 (three) times daily. 06/10/13   Jasper Riling. Elizer Bostic, MD  polyethylene glycol (MIRALAX / GLYCOLAX) packet Take 17 g by mouth daily. 06/10/13   Jasper Riling. Breanne Olvera, MD   BP 142/86  Pulse 94  Temp(Src) 100.2 F (37.9 C) (Oral)  Resp 16  SpO2 100% Physical Exam  Nursing note and vitals reviewed. Constitutional: He is oriented to person, place, and time. He appears well-developed and well-nourished.  HENT:  Head: Normocephalic and atraumatic.  Eyes: EOM are  normal. Pupils are equal, round, and reactive to light.  Neck: Normal range of motion. Neck supple.  Cardiovascular: Normal rate, regular rhythm and normal heart sounds.   No murmur heard. Pulmonary/Chest: Effort normal and breath sounds normal.  Abdominal: Soft. Bowel sounds are normal. He exhibits no distension and no mass. There is tenderness. There is no rebound and no guarding.  Suprapubic tenderness without rebound or guarding.  Musculoskeletal: Normal range of motion. He exhibits no edema.  Neurological: He is alert and oriented to person, place, and time. No cranial nerve deficit.  Skin: Skin is warm and dry.  Psychiatric: He has a normal mood and affect.    ED Course  Procedures (including critical care time) Labs Review Labs Reviewed  CBC - Abnormal; Notable for the following:    WBC 16.8 (*)    All other components within normal limits  COMPREHENSIVE METABOLIC PANEL - Abnormal; Notable for the following:    Glucose, Bld 131 (*)    BUN 26 (*)    Creatinine, Ser 1.36 (*)    GFR calc non Af Amer 47 (*)    GFR calc Af Amer 55 (*)    All other components within normal limits    Imaging Review Ct Abdomen Pelvis W Contrast  06/10/2013   CLINICAL DATA:  Abdominal pain.  EXAM: CT ABDOMEN AND PELVIS WITH CONTRAST  TECHNIQUE: Multidetector CT imaging of the abdomen and pelvis was performed using the standard protocol following bolus administration of intravenous contrast.  CONTRAST:  63mL OMNIPAQUE IOHEXOL 300 MG/ML  SOLN  COMPARISON:  CT ABD/PELVIS W CM dated 11/17/2012  FINDINGS: Slight increase in size of 6 mm nodule along the minor fissure. The additional sub solid pulmonary nodules in the right middle lobe are not identified on this examination though, may be out of the field of view. Stable 3 mm right lower lobe sub solid pulmonary nodule, axial 13/29, below size surveillance recommendations. The included heart and pericardium are unremarkable.  The liver, spleen, pancreas,  gallbladder and adrenal glands are not suspicious.  Tiny hiatal hernia. Stomach, muscle oral and large bowel are normal as are normal course and caliber without inflammatory changes. Status post right hemicolectomy. The rectum is distended with stool, with surrounding mild inflammatory changes. Trace free fluid in the pelvis without abscess or pneumoperitoneum. Mild misty mesentery, nonspecific finding and relatively unchanged.  Kidneys are well located, symmetric enhancement, multiple cysts bilaterally. Punctate right lower pole nephrolithiasis, 2 mm right upper pole nephrolithiasis, punctate left upper pole calculus. Delayed imaging demonstrates prompt symmetric excretion of contrast into the proximal urinary collecting system. Aortoiliac vessels are overall normal course and caliber with mild calcific atherosclerosis. Urinary bladder is partially distended, unremarkable. Prostate is 5 cm  in transaxial dimension with coarse calcifications.  Small moderate bilateral fat containing inguinal hernias. Severe degenerative change of the lumbar spine with grade 1 L4-5 anterolisthesis, chronic moderate to severe L1 burst fracture.  IMPRESSION: Stool distended rectum with inflammatory changes, is unclear if this could be infectious or inflammatory. No bowel obstruction.  Mild misty mesentery, nonspecific finding and relatively unchanged.  Bilateral nonobstructing tiny nephrolithiasis. Bilateral renal cysts.   Electronically Signed   By: Elon Alas   On: 06/10/2013 22:22     EKG Interpretation None      MDM   Final diagnoses:  Constipation  Colitis    Patient with constipation and rectal inflammatory changes. We'll treat as infection. Will give laxatives and antibiotics. Will followup with his primary care Dr.    Jasper Riling. Alvino Chapel, MD 06/10/13 [id number]

## 2013-06-19 ENCOUNTER — Telehealth (INDEPENDENT_AMBULATORY_CARE_PROVIDER_SITE_OTHER): Payer: Self-pay

## 2013-06-19 ENCOUNTER — Encounter (INDEPENDENT_AMBULATORY_CARE_PROVIDER_SITE_OTHER): Payer: Self-pay | Admitting: Surgery

## 2013-06-19 ENCOUNTER — Ambulatory Visit (INDEPENDENT_AMBULATORY_CARE_PROVIDER_SITE_OTHER): Payer: Commercial Managed Care - HMO | Admitting: Surgery

## 2013-06-19 VITALS — BP 170/94 | HR 75 | Temp 97.4°F | Resp 18 | Wt 188.0 lb

## 2013-06-19 DIAGNOSIS — K5900 Constipation, unspecified: Secondary | ICD-10-CM

## 2013-06-19 MED ORDER — AMBULATORY NON FORMULARY MEDICATION: 1.0000 "application " | Freq: Four times a day (QID) | Status: DC

## 2013-06-19 NOTE — Patient Instructions (Signed)
Polyethylene Glycol; Electrolytes oral solution What is this medicine? POLYETHYLENE GLYCOL; ELECTROLYTES (pol ee ETH i leen GLYE col; i LEK truh lahyts) is a laxative. It is used to clean out the bowel before a colonoscopy. This medicine may be used for other purposes; ask your health care provider or pharmacist if you have questions. COMMON BRAND NAME(S): Colyte, GaviLyte-C, GaviLyte-G, GaviLyte-N, GoLYTELY, NuLYTELY, OCL, Suclear, TriLyte What should I tell my health care provider before I take this medicine? They need to know if you have any of these conditions: -any chronic disease of the intestine, stomach, or throat -bloating or pain in stomach area -difficulty swallowing -G6PD deficiency -heart disease -kidney disease -low levels of sodium in the blood -phenylketonuria -seizures -an unusual or allergic reaction to polyethylene glycol, other medicines, dyes, or preservatives -pregnant or trying to get pregnant -breast-feeding How should I use this medicine? Take this medicine by mouth. Before using this medicine you or your pharmacist must fill the container with the amount of water indicated on the package label. Chill solution before using to improve taste. Shake well before each dose. Your doctor will tell you what time to start this medicine. Take exactly as directed. You will usually have the first bowel movement about 1 hour after you begin drinking the solution. After that, you will have frequent watery bowel movements. You will need to follow a special diet before your procedure. Talk to your doctor. Do not eat any solid foods for 3 to 4 hours before taking this medicine. A special MedGuide will be given to you by the pharmacist with each prescription and refill. Be sure to read this information carefully each time. Talk to your pediatrician regarding the use of this medicine in children. Special care may be needed. Overdosage: If you think you have taken too much of this  medicine contact a poison control center or emergency room at once. NOTE: This medicine is only for you. Do not share this medicine with others. What if I miss a dose? You should talk to your doctor if you are not able to complete the bowel preparation as advised. What may interact with this medicine? Do not take any other medicine by mouth starting one hour before you take this medicine. Talk to your doctor about when to take your other medicines. This medicine may interact with the following medications: -certain medicines for blood pressure, heart disease, irregular heart beat -certain medicines for kidney disease -certain medicines for seizures like carbamazepine, phenobarbital, phenytoin -diuretics -laxatives -NSAIDS, medicines for pain and inflammation, like ibuprofen or naproxen This list may not describe all possible interactions. Give your health care provider a list of all the medicines, herbs, non-prescription drugs, or dietary supplements you use. Also tell them if you smoke, drink alcohol, or use illegal drugs. Some items may interact with your medicine. What should I watch for while using this medicine? Tell your doctor if you experience any changes in bowel habits that are severe or last longer than three weeks. Do not inhale dust from the solution powder. This can make breathing difficult or may cause sneezing or an allergic-type reaction. Bloating may occur before the first bowel movement. If your discomfort continues while you are drinking the solution, stop drinking the solution temporarily or drink each portion with longer spaces in between until your symptoms disappear. Contact your doctor or health care professional if you have any concerns. What side effects may I notice from receiving this medicine? Side effects that you should report to  your doctor or health care professional as soon as possible: -allergic reactions like skin rash, itching or hives, swelling of the face,  lips, or tongue -breathing problems -chest tightness -dizziness -fast, irregular heartbeat -headache -seizures -trouble passing urine or change in the amount of urine -vomiting Side effects that usually do not require medical attention (report to your doctor or health care professional if they continue or are bothersome): -anal irritation -bloating, pain, or distension of the stomach -chills -increased hunger or thirst -nausea -stomach gas -tiredness -trouble sleeping This list may not describe all possible side effects. Call your doctor for medical advice about side effects. You may report side effects to FDA at 1-800-FDA-1088. Where should I keep my medicine? Keep out of the reach of children. Store the solution in the refrigerator to improve the taste. Do not freeze. Throw away any unused medicine 48 hours after mixing. NOTE: This sheet is a summary. It may not cover all possible information. If you have questions about this medicine, talk to your doctor, pharmacist, or health care provider.  2014, [employer]. (2009-07-15 12:51:08)

## 2013-06-19 NOTE — Progress Notes (Signed)
The patient's wife called because he's been miserable and has been having difficulty with defecation. I reviewed the CT scan from the ER and he appeared to have an impaction. I requested that he come in for recheck him to see if there was an impaction that need to be disimpacted. The patient on arrival did admit that he had seen Dr. Alroy Dust who checked him in the did not see an impaction. In the emergency room however they did not check him for an impaction.  On my exam his sphincter muscles are quite tender. There is good sphincter tone. I can get above the rim of the sphincters and feel no hard rock mass in that area. Prostate is palpable.  I will recommend to him Lead to drink a gallon over a few hours and I will also give him diltiazem to apply rectally for his sphincter with spasm. We will see him again on an as-needed basis. I will refer him back to Dr. Alroy Dust to assist in this ongoing workup.

## 2013-06-19 NOTE — Telephone Encounter (Signed)
Pt's wife is calling today.  Pt was seen in [facility] ED on 06/10/13 for severe constipation, dehydration and pain.  He was treated for colitis and sent home.  According to his wife there has been little to no improvement since then.  He has been taking the MiraLax, and is on liquids for bowel rest.  Pt is still not having any bowel movements and is very uncomfortable.  Does this patient need to be seen in the office because of his long hx?  Please advise.

## 2013-08-02 ENCOUNTER — Encounter (HOSPITAL_COMMUNITY): Payer: Self-pay | Admitting: Emergency Medicine

## 2013-08-02 ENCOUNTER — Emergency Department (HOSPITAL_COMMUNITY): Payer: Medicare HMO

## 2013-08-02 ENCOUNTER — Inpatient Hospital Stay (HOSPITAL_COMMUNITY)
Admission: EM | Admit: 2013-08-02 | Discharge: 2013-08-05 | DRG: 378 | Disposition: A | Discharge status: Home / Self Care | Payer: Medicare HMO | Attending: Internal Medicine | Admitting: Internal Medicine

## 2013-08-02 DIAGNOSIS — Z87891 Personal history of nicotine dependence: Secondary | ICD-10-CM

## 2013-08-02 DIAGNOSIS — G8929 Other chronic pain: Secondary | ICD-10-CM | POA: Diagnosis present

## 2013-08-02 DIAGNOSIS — H409 Unspecified glaucoma: Secondary | ICD-10-CM | POA: Diagnosis present

## 2013-08-02 DIAGNOSIS — H5450 Low vision, one eye, unspecified eye: Secondary | ICD-10-CM | POA: Diagnosis present

## 2013-08-02 DIAGNOSIS — K922 Gastrointestinal hemorrhage, unspecified: Secondary | ICD-10-CM | POA: Diagnosis present

## 2013-08-02 DIAGNOSIS — Z860100 Personal history of colon polyps, unspecified: Secondary | ICD-10-CM

## 2013-08-02 DIAGNOSIS — L719 Rosacea, unspecified: Secondary | ICD-10-CM

## 2013-08-02 DIAGNOSIS — D62 Acute posthemorrhagic anemia: Secondary | ICD-10-CM | POA: Diagnosis present

## 2013-08-02 DIAGNOSIS — K635 Polyp of colon: Secondary | ICD-10-CM

## 2013-08-02 DIAGNOSIS — Z823 Family history of stroke: Secondary | ICD-10-CM

## 2013-08-02 DIAGNOSIS — K254 Chronic or unspecified gastric ulcer with hemorrhage: Principal | ICD-10-CM | POA: Diagnosis present

## 2013-08-02 DIAGNOSIS — Z79899 Other long term (current) drug therapy: Secondary | ICD-10-CM

## 2013-08-02 DIAGNOSIS — D126 Benign neoplasm of colon, unspecified: Secondary | ICD-10-CM

## 2013-08-02 DIAGNOSIS — K5900 Constipation, unspecified: Secondary | ICD-10-CM

## 2013-08-02 DIAGNOSIS — K279 Peptic ulcer, site unspecified, unspecified as acute or chronic, without hemorrhage or perforation: Secondary | ICD-10-CM

## 2013-08-02 DIAGNOSIS — Z9049 Acquired absence of other specified parts of digestive tract: Secondary | ICD-10-CM

## 2013-08-02 DIAGNOSIS — G4730 Sleep apnea, unspecified: Secondary | ICD-10-CM | POA: Diagnosis present

## 2013-08-02 DIAGNOSIS — Z9849 Cataract extraction status, unspecified eye: Secondary | ICD-10-CM

## 2013-08-02 DIAGNOSIS — IMO0002 Reserved for concepts with insufficient information to code with codable children: Secondary | ICD-10-CM

## 2013-08-02 DIAGNOSIS — R5383 Other fatigue: Secondary | ICD-10-CM

## 2013-08-02 DIAGNOSIS — I10 Essential (primary) hypertension: Secondary | ICD-10-CM | POA: Diagnosis present

## 2013-08-02 DIAGNOSIS — L711 Rhinophyma: Secondary | ICD-10-CM

## 2013-08-02 LAB — COMPREHENSIVE METABOLIC PANEL
ALBUMIN: 3.4 g/dL — AB (ref 3.5–5.2)
ALT: 9 U/L (ref 0–53)
AST: 14 U/L (ref 0–37)
Alkaline Phosphatase: 40 U/L (ref 39–117)
BUN: 22 mg/dL (ref 6–23)
CO2: 25 mEq/L (ref 19–32)
Calcium: 9.2 mg/dL (ref 8.4–10.5)
Chloride: 104 mEq/L (ref 96–112)
Creatinine, Ser: 1.33 mg/dL (ref 0.50–1.35)
GFR calc non Af Amer: 48 mL/min — ABNORMAL LOW (ref >90)
GFR, EST AFRICAN AMERICAN: 56 mL/min — AB (ref >90)
Glucose, Bld: 94 mg/dL (ref 70–99)
Potassium: 4.1 mEq/L (ref 3.7–5.3)
SODIUM: 141 meq/L (ref 137–147)
TOTAL PROTEIN: 6.2 g/dL (ref 6.0–8.3)
Total Bilirubin: 0.6 mg/dL (ref 0.3–1.2)

## 2013-08-02 LAB — LIPASE, BLOOD: Lipase: 54 U/L (ref 11–59)

## 2013-08-02 LAB — CBC WITH DIFFERENTIAL/PLATELET
BASOS PCT: 0 % (ref 0–1)
Basophils Absolute: 0 10*3/uL (ref 0.0–0.1)
EOS ABS: 0.2 10*3/uL (ref 0.0–0.7)
EOS PCT: 3 % (ref 0–5)
HCT: 28.2 % — ABNORMAL LOW (ref 39.0–52.0)
Hemoglobin: 9.8 g/dL — ABNORMAL LOW (ref 13.0–17.0)
Lymphocytes Relative: 36 % (ref 12–46)
Lymphs Abs: 2.5 10*3/uL (ref 0.7–4.0)
MCH: 32.7 pg (ref 26.0–34.0)
MCHC: 34.8 g/dL (ref 30.0–36.0)
MCV: 94 fL (ref 78.0–100.0)
Monocytes Absolute: 0.5 10*3/uL (ref 0.1–1.0)
Monocytes Relative: 8 % (ref 3–12)
NEUTROS PCT: 53 % (ref 43–77)
Neutro Abs: 3.7 10*3/uL (ref 1.7–7.7)
PLATELETS: 195 10*3/uL (ref 150–400)
RBC: 3 MIL/uL — ABNORMAL LOW (ref 4.22–5.81)
RDW: 14.2 % (ref 11.5–15.5)
WBC: 7 10*3/uL (ref 4.0–10.5)

## 2013-08-02 LAB — URINALYSIS, ROUTINE W REFLEX MICROSCOPIC
Bilirubin Urine: NEGATIVE
Glucose, UA: NEGATIVE mg/dL
HGB URINE DIPSTICK: NEGATIVE
Ketones, ur: NEGATIVE mg/dL
Leukocytes, UA: NEGATIVE
Nitrite: NEGATIVE
PH: 6.5 (ref 5.0–8.0)
Protein, ur: NEGATIVE mg/dL
SPECIFIC GRAVITY, URINE: 1.016 (ref 1.005–1.030)
UROBILINOGEN UA: 1 mg/dL (ref 0.0–1.0)

## 2013-08-02 LAB — TYPE AND SCREEN
ABO/RH(D): A POS
Antibody Screen: NEGATIVE

## 2013-08-02 MED ORDER — ACETAMINOPHEN 500 MG PO TABS
500.0000 mg | ORAL_TABLET | Freq: Four times a day (QID) | ORAL | Status: DC | PRN
Start: 2013-08-02 — End: 2013-08-05

## 2013-08-02 MED ORDER — POLYETHYLENE GLYCOL 3350 17 G PO PACK
17.0000 g | PACK | Freq: Every day | ORAL | Status: DC
  Administered 2013-08-02 – 2013-08-05 (×2): 17 g via ORAL
  Filled 2013-08-02 (×4): qty 1

## 2013-08-02 MED ORDER — MORPHINE SULFATE 2 MG/ML IJ SOLN
2.0000 mg | INTRAMUSCULAR | Status: DC | PRN
  Administered 2013-08-02: 2 mg via INTRAVENOUS
  Filled 2013-08-02: qty 1

## 2013-08-02 MED ORDER — SODIUM CHLORIDE 0.9 % IV SOLN: 250.0000 mL | INTRAVENOUS | Status: DC | PRN

## 2013-08-02 MED ORDER — SODIUM CHLORIDE 0.9 % IV BOLUS (SEPSIS)
500.0000 mL | INTRAVENOUS | Status: AC
  Administered 2013-08-02: 12:00:00 via INTRAVENOUS

## 2013-08-02 MED ORDER — IOHEXOL 300 MG/ML  SOLN
50.0000 mL | Freq: Once | INTRAMUSCULAR | Status: AC | PRN
  Administered 2013-08-02: 50 mL via ORAL

## 2013-08-02 MED ORDER — SODIUM CHLORIDE 0.9 % IJ SOLN
3.0000 mL | Freq: Two times a day (BID) | INTRAMUSCULAR | Status: DC
  Administered 2013-08-02 – 2013-08-05 (×5): 3 mL via INTRAVENOUS

## 2013-08-02 MED ORDER — HYDROCODONE-ACETAMINOPHEN 5-325 MG PO TABS
1.0000 | ORAL_TABLET | ORAL | Status: DC | PRN
  Administered 2013-08-04: 1 via ORAL
  Filled 2013-08-02: qty 1

## 2013-08-02 MED ORDER — PANTOPRAZOLE SODIUM 40 MG IV SOLR
40.0000 mg | Freq: Two times a day (BID) | INTRAVENOUS | Status: DC
  Administered 2013-08-02 – 2013-08-05 (×6): 40 mg via INTRAVENOUS
  Filled 2013-08-02 (×7): qty 40

## 2013-08-02 MED ORDER — IOHEXOL 300 MG/ML  SOLN
100.0000 mL | Freq: Once | INTRAMUSCULAR | Status: AC | PRN
  Administered 2013-08-02: 100 mL via INTRAVENOUS

## 2013-08-02 MED ORDER — AMLODIPINE BESYLATE 5 MG PO TABS
5.0000 mg | ORAL_TABLET | Freq: Every day | ORAL | Status: DC
  Administered 2013-08-02 – 2013-08-05 (×4): 5 mg via ORAL
  Filled 2013-08-02 (×4): qty 1

## 2013-08-02 MED ORDER — SODIUM CHLORIDE 0.9 % IJ SOLN: 3.0000 mL | INTRAMUSCULAR | Status: DC | PRN

## 2013-08-02 NOTE — H&P (Addendum)
[facility] History and Physical  Anthony Tyler GBT:[id number] DOB: October 24, 1930 DOA: 08/02/2013  Referring physician:  PCP: Default, Provider, MD  Specialists:   Chief Complaint: Dark Tarry Stools and weakness  HPI: Anthony Tyler is a 80 y.o. male  With a history of hypertension, benign cecal adenoma status post colectomy in September of 2013 that presents to the emergency department for melanotic stools and weakness. Patient states that on Wednesday his stools were very dark colored almost black. On Thursday patient states he was actually going to the bathroom more frequently which was strange as patient has a history of constipation. Again at that time he was noted to have very dark tarry stools. Patient went to see his primary care physician which at that point referred him to gastroenterology for an outpatient appointment this coming Tuesday. Patient was due for colonoscopy in October of November 2014 however failed to do so. At this time patient denies any abdominal pain however per family patient has had some pain in the left lower quadrant that since Wednesday.  Patient denies any nausea or vomiting. He also complains of feeling generally weak. States he has a history of back problems.  Review of Systems:  Constitutional: Denies fever, chills, diaphoresis, appetite change and fatigue.  HEENT: Denies photophobia, eye pain, redness, hearing loss, ear pain, congestion, sore throat, rhinorrhea, sneezing, mouth sores, trouble swallowing, neck pain, neck stiffness and tinnitus.   Respiratory: Denies SOB, DOE, cough, chest tightness,  and wheezing.   Cardiovascular: Denies chest pain, palpitations and leg swelling.  Gastrointestinal: Complains of abdominal pain and dark tarry stools with constipation. Genitourinary: Denies dysuria, urgency, frequency, hematuria, flank pain and difficulty urinating.  Musculoskeletal: Has chronic back pain. Neurological: Complains of generalized  fatigue. Hematological: Denies adenopathy. Easy bruising, personal or family bleeding history  Psychiatric/Behavioral: Denies suicidal ideation, mood changes, confusion, nervousness, sleep disturbance and agitation  Past Medical History  Diagnosis Date  . Hypertension   . Glaucoma     BOTH EYES  . Cataract     LEFT EYE  . Partial blindness     RIGHT EYE  . Polyp of colon     DYSPLASIA IN A CECAL  POLYP - FOUND ON COLONOSCOPY  . Enlargement of the nose     OUTER NOSE VERY ENLARGED--STATES HE SOMETIMES MASHES BUMPS ON THE NOSE AND BLOOD WILL COME OUT OF BUMPS.  PT DOES NOT HAVE PCP--SAW A DERMATOLOGIST ONCE--BUT STOPPED THE "PILLS" HE WAS GIVEN-DIDN'T LIKE THE WAY THE MED MADE HIM FEEL  . Hoarse     ON GOING FOR A WHILE  . Warts     ON RIGHT EAR--STATES HE WAS BORN WITH THE WARTS  . Pain     LOWER BACK WITH PROLONGED STANDING  . Sleep apnea 11/03/11    STOP BANG SCORE OF 5   Past Surgical History  Procedure Laterality Date  . Cataract extraction  03/2011    right  . Hernia repair  2008  . Eye surgery      RETINAL SURGERY RT EYE FOR EYE HEMORRHAGE--THEN HAD CATARACT REMOVED RT EYE  . Tonsillectomy      AGE 89  . Hemicolectomy     Social History:  reports that he quit smoking about 65 years ago. He has never used smokeless tobacco. He reports that he does not drink alcohol or use illicit drugs. Married, lives at home with his wife.  No Known Allergies  Family History  Problem Relation Age of Onset  .  Stroke Mother     Prior to Admission medications   Medication Sig Start Date End Date Taking? Authorizing Provider  acetaminophen (TYLENOL) 500 MG tablet Take 500 mg by mouth every 6 (six) hours as needed for pain. PAIN   Yes Historical Provider, MD  amLODipine (NORVASC) 5 MG tablet Take 5 mg by mouth daily.  12/11/11  Yes Historical Provider, MD  esomeprazole (NEXIUM) 20 MG capsule Take 20 mg by mouth daily at 12 noon.   Yes Historical Provider, MD  Menthol-Methyl Salicylate  (MUSCLE RUB) 10-15 % CREA Apply 1 application topically as needed for muscle pain.   Yes Historical Provider, MD  polyethylene glycol (MIRALAX / GLYCOLAX) packet Take 17 g by mouth daily. 06/10/13  Yes Anthony Tyler. Anthony Chapel, MD   Physical Exam: Filed Vitals:   08/02/13 1410  BP: 142/79  Pulse: 79  Temp: 98.6 F (37 C)  Resp: 17     General: Well developed, well nourished, NAD, appears stated age  HEENT: NCAT, PERRLA, EOMI, Anicteic Sclera, mucous membranes moist.  Rhinophyma  Neck: Supple, no JVD, no masses  Cardiovascular: S1 S2 auscultated, no rubs, murmurs or gallops. Regular rate and rhythm.  Respiratory: Clear to auscultation bilaterally with equal chest rise  Abdomen: Soft, nontender, nondistended, + bowel sounds, left inguinal mass  Rectal: Normal appearing external rectum  Extremities: warm dry without cyanosis clubbing or edema  Neuro: AAOx3, cranial nerves grossly intact. Strength 5/5 in patient's upper and lower extremities bilaterally  Skin: Without rashes exudates or nodules  Psych: Normal affect and demeanor with intact judgement and insight  Labs on Admission:  Basic Metabolic Panel:  Recent Labs Lab 08/02/13 1220  NA 141  K 4.1  CL 104  CO2 25  GLUCOSE 94  BUN 22  CREATININE 1.33  CALCIUM 9.2   Liver Function Tests:  Recent Labs Lab 08/02/13 1220  AST 14  ALT 9  ALKPHOS 40  BILITOT 0.6  PROT 6.2  ALBUMIN 3.4*    Recent Labs Lab 08/02/13 1220  LIPASE 54   No results found for this basename: AMMONIA,  in the last 168 hours CBC:  Recent Labs Lab 08/02/13 1220  WBC 7.0  NEUTROABS 3.7  HGB 9.8*  HCT 28.2*  MCV 94.0  PLT 195   Cardiac Enzymes: No results found for this basename: CKTOTAL, CKMB, CKMBINDEX, TROPONINI,  in the last 168 hours  BNP (last 3 results) No results found for this basename: PROBNP,  in the last 8760 hours CBG: No results found for this basename: GLUCAP,  in the last 168 hours  Radiological Exams on  Admission: Ct Abdomen Pelvis W Contrast  08/02/2013   CLINICAL DATA:  Periumbilical pain and constipation  EXAM: CT ABDOMEN AND PELVIS WITH CONTRAST  TECHNIQUE: Multidetector CT imaging of the abdomen and pelvis was performed using the standard protocol following bolus administration of intravenous contrast.  CONTRAST:  21mL OMNIPAQUE IOHEXOL 300 MG/ML SOLN, 181mL OMNIPAQUE IOHEXOL 300 MG/ML SOLN  COMPARISON:  Prior CT abdomen/ pelvis 06/10/2013  FINDINGS: Lower Chest: The lung bases are clear. Visualized cardiac structures are within normal limits for size. No pericardial effusion. Unremarkable visualized distal thoracic esophagus.  Abdomen: Unremarkable CT appearance of the stomach, duodenum, spleen, adrenal glands and pancreas. Normal hepatic contour and morphology. No discrete hepatic lesion. Gallbladder is unremarkable. No intra or extrahepatic biliary ductal dilatation.  Numerous exophytic water attenuation lesions from both kidneys consistent with simple cysts. The largest is exophytic from the upper pole of the right  kidney and measures 1.1 x 9.8 cm. The largest on the left is exophytic from the lower pole and measures 7.6 by 6.5 cm. No hydronephrosis or nephrolithiasis. No solid enhancing renal mass.  Interval resolution of rectal fecal impaction. At least 3 polypoid soft tissue nodules are present in the posterior aspect of the rectum measuring up to 1 cm in size. No significant focal wall thickening or pericolonic inflammation. No evidence of obstruction. Surgical changes of right hemicolectomy. No suspicious adenopathy or free fluid.  Pelvis: Unremarkable bladder, prostate gland and seminal vesicles. No free fluid or suspicious adenopathy. Small fat containing left inguinal hernia. Surgical changes of prior right inguinal hernia repair without evidence of recurrence. Additional ancillary findings as above without significant interval change compared to prior imaging.  Bones/Soft Tissues: No acute  fracture or aggressive appearing lytic or blastic osseous lesion. Stable appearance of L1 compression fracture with approximately 60% height loss anteriorly and 6 mm of posterior bony retropulsion. There is focal kyphosis at this level. Extensive multilevel degenerative disc disease with mild grade 1 anterolisthesis of L4 on L5. Multilevel disc vacuum phenomenon. Extensive anterior spurring.  Vascular: Mild atherosclerotic vascular calcifications. 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 No further imaging followup is required for this abnormality given the marked stability over time.  IMPRESSION: 1. No acute abnormality in the abdomen or pelvis to explain the patient's clinical symptoms. 2. Interval resolution of rectal fecal impaction compared to 06/10/2013. 3. Three small residual polypoid densities are present in the posterior aspect of the rectal vault measuring up to 1 cm in size. These are strongly favored to reflect small nodules of dependently layering fecal material. Additional considerations include internal hemorrhoids and soft tissue polyps. 4. Stable appearance of numerous bilateral renal cysts, some of which are large and exophytic.   Electronically Signed   By: Jacqulynn Cadet M.D.   On: 08/02/2013 14:09    EKG: Independently reviewed. None  Assessment/Plan  Abdominal pain secondary to GI bleed -Likely upper -Patient will be admitted to medical floor -Baseline hemoglobin appears to be 14, currently 9.8, will transfuse if hemoglobin drops to 7 -Patient presented to primary care physician's office on Thursday and was found to have dark stools -Hemoccult card was positive -Gastroenterology, Dr. Narda Amber has been consulted -Will place patient on IV Protonix 40 mg twice daily -Will place patient on full liquid diet -CT of the abdomen and pelvis with contrast: No acute  abnormality in the abdomen or pelvis, rectal fecal impaction resolution, 3 small residual polypoid densities in the posterior aspect of the rectal vault  History a benign cecal adenoma  -status post colectomy in September 2013  Hypertension -Stable, will continue amlodipine  Generalized weakness -Likely secondary to GI bleed  Normocytic Anemia -Secondary to GI bleed -Treatment and plan as above  DVT prophylaxis: SCDs  Code Status: Full  Condition: Guarded  Family Communication: Family at bedside. Admission, patients condition and plan of care including tests being ordered have been discussed with the patient and family who indicate understanding and agree with the plan and Code Status.  Disposition Plan: Admitted  Time spent: 60 minutes  [place] D.O. [facility] Pager [phone]  If 7PM-7AM, please contact night-coverage [email or web address] Password [email or web address] 08/02/2013, 2:56 PM

## 2013-08-02 NOTE — ED Notes (Signed)
Patient is aware that a urine sample is needed, urinal is at the bedside.  

## 2013-08-02 NOTE — ED Provider Notes (Signed)
CSN: [id number]     Arrival date & time 08/02/13  1058 History   First MD Initiated Contact with Patient 08/02/13 1120     Chief Complaint  Patient presents with  . Constipation     (Consider location/radiation/quality/duration/timing/severity/associated sxs/prior Treatment) Patient is a 80 y.o. male presenting with constipation. The history is provided by the patient.  Constipation Severity:  Moderate Time since last bowel movement:  2 days Timing:  Constant Progression:  Worsening Chronicity:  Chronic Context comment:  At rest Stool description:  Small Unusual stool frequency:  Every few days Relieved by: miralax. Worsened by:  Nothing tried Ineffective treatments:  None tried Associated symptoms: abdominal pain   Associated symptoms: no diarrhea, no dysuria, no fever, no nausea and no vomiting   Abdominal pain:    Location:  Periumbilical   Quality:  Aching   Severity:  Mild   Onset quality:  Gradual   Duration:  2 days   Timing:  Constant   Progression:  Unchanged   Chronicity:  New   Past Medical History  Diagnosis Date  . Hypertension   . Glaucoma     BOTH EYES  . Cataract     LEFT EYE  . Partial blindness     RIGHT EYE  . Polyp of colon     DYSPLASIA IN A CECAL  POLYP - FOUND ON COLONOSCOPY  . Enlargement of the nose     OUTER NOSE VERY ENLARGED--STATES HE SOMETIMES MASHES BUMPS ON THE NOSE AND BLOOD WILL COME OUT OF BUMPS.  PT DOES NOT HAVE PCP--SAW A DERMATOLOGIST ONCE--BUT STOPPED THE "PILLS" HE WAS GIVEN-DIDN'T LIKE THE WAY THE MED MADE HIM FEEL  . Hoarse     ON GOING FOR A WHILE  . Warts     ON RIGHT EAR--STATES HE WAS BORN WITH THE WARTS  . Pain     LOWER BACK WITH PROLONGED STANDING  . Sleep apnea 11/03/11    STOP BANG SCORE OF 5   Past Surgical History  Procedure Laterality Date  . Cataract extraction  03/2011    right  . Hernia repair  2008  . Eye surgery      RETINAL SURGERY RT EYE FOR EYE HEMORRHAGE--THEN HAD CATARACT REMOVED RT EYE  .  Tonsillectomy      AGE 82  . Hemicolectomy     Family History  Problem Relation Age of Onset  . Stroke Mother    History  Substance Use Topics  . Smoking status: Former Smoker    Quit date: 10/25/1947  . Smokeless tobacco: Never Used  . Alcohol Use: No    Review of Systems  Constitutional: Positive for chills. Negative for fever.  HENT: Negative for drooling and rhinorrhea.   Eyes: Negative for pain.  Respiratory: Negative for cough and shortness of breath.   Cardiovascular: Negative for chest pain and leg swelling.  Gastrointestinal: Positive for abdominal pain and constipation. Negative for nausea, vomiting and diarrhea.  Genitourinary: Negative for dysuria and hematuria.  Musculoskeletal: Negative for gait problem and neck pain.  Skin: Negative for color change.  Neurological: Negative for numbness and headaches.  Hematological: Negative for adenopathy.  Psychiatric/Behavioral: Negative for behavioral problems.  All other systems reviewed and are negative.     Allergies  Review of patient's allergies indicates no known allergies.  Home Medications   Prior to Admission medications   Medication Sig Start Date End Date Taking? Authorizing Provider  acetaminophen (TYLENOL) 500 MG tablet Take 500 mg by  mouth every 6 (six) hours as needed for pain. PAIN    Historical Provider, MD  AMBULATORY NON FORMULARY MEDICATION Place 1 application rectally 4 (four) times daily. Diltiazem 2% compounded suspension. 06/19/13   Pedro Earls, MD  amLODipine (NORVASC) 5 MG tablet Take 5 mg by mouth daily.  12/11/11   Historical Provider, MD  magnesium citrate SOLN Take 296 mLs (1 Bottle total) by mouth once. 06/10/13   Jasper Riling. Pickering, MD  polyethylene glycol (MIRALAX / GLYCOLAX) packet Take 17 g by mouth daily. 06/10/13   Jasper Riling. Pickering, MD   BP 134/85  Pulse 76  Temp(Src) 100 F (37.8 C) (Oral)  Resp 18  SpO2 100% Physical Exam  Nursing note and vitals  reviewed. Constitutional: He is oriented to person, place, and time. He appears well-developed and well-nourished.  HENT:  Head: Normocephalic and atraumatic.  Right Ear: External ear normal.  Left Ear: External ear normal.  Nose: Nose normal.  Mouth/Throat: Oropharynx is clear and moist. No oropharyngeal exudate.  Eyes: Conjunctivae and EOM are normal. Pupils are equal, round, and reactive to light.  Neck: Normal range of motion. Neck supple.  Cardiovascular: Normal rate, regular rhythm, normal heart sounds and intact distal pulses.  Exam reveals no gallop and no friction rub.   No murmur heard. Pulmonary/Chest: Effort normal and breath sounds normal. No respiratory distress. He has no wheezes.  Abdominal: Soft. Bowel sounds are normal. He exhibits no distension. There is no tenderness. There is no rebound and no guarding.  Mild prominent soft swelling in the left inguinal region. The mass does not extend into the scrotum. It is nontender. It does not reduce with palpation.  Genitourinary:  Normal appearing external rectum.  Normal rectal exam. I did not get a good stool specimen however the Hemoccult card was positive.  Musculoskeletal: Normal range of motion. He exhibits no edema and no tenderness.  Neurological: He is alert and oriented to person, place, and time.  Skin: Skin is warm and dry.  Psychiatric: He has a normal mood and affect. His behavior is normal.    ED Course  Procedures (including critical care time) Labs Review Labs Reviewed  CBC WITH DIFFERENTIAL - Abnormal; Notable for the following:    RBC 3.00 (*)    Hemoglobin 9.8 (*)    HCT 28.2 (*)    All other components within normal limits  COMPREHENSIVE METABOLIC PANEL - Abnormal; Notable for the following:    Albumin 3.4 (*)    GFR calc non Af Amer 48 (*)    GFR calc Af Amer 56 (*)    All other components within normal limits  LIPASE, BLOOD  URINALYSIS, ROUTINE W REFLEX MICROSCOPIC  TYPE AND SCREEN     Imaging Review Ct Abdomen Pelvis W Contrast  08/02/2013   CLINICAL DATA:  Periumbilical pain and constipation  EXAM: CT ABDOMEN AND PELVIS WITH CONTRAST  TECHNIQUE: Multidetector CT imaging of the abdomen and pelvis was performed using the standard protocol following bolus administration of intravenous contrast.  CONTRAST:  42mL OMNIPAQUE IOHEXOL 300 MG/ML SOLN, 197mL OMNIPAQUE IOHEXOL 300 MG/ML SOLN  COMPARISON:  Prior CT abdomen/ pelvis 06/10/2013  FINDINGS: Lower Chest: The lung bases are clear. Visualized cardiac structures are within normal limits for size. No pericardial effusion. Unremarkable visualized distal thoracic esophagus.  Abdomen: Unremarkable CT appearance of the stomach, duodenum, spleen, adrenal glands and pancreas. Normal hepatic contour and morphology. No discrete hepatic lesion. Gallbladder is unremarkable. No intra or extrahepatic biliary ductal  dilatation.  Numerous exophytic water attenuation lesions from both kidneys consistent with simple cysts. The largest is exophytic from the upper pole of the right kidney and measures 1.1 x 9.8 cm. The largest on the left is exophytic from the lower pole and measures 7.6 by 6.5 cm. No hydronephrosis or nephrolithiasis. No solid enhancing renal mass.  Interval resolution of rectal fecal impaction. At least 3 polypoid soft tissue nodules are present in the posterior aspect of the rectum measuring up to 1 cm in size. No significant focal wall thickening or pericolonic inflammation. No evidence of obstruction. Surgical changes of right hemicolectomy. No suspicious adenopathy or free fluid.  Pelvis: Unremarkable bladder, prostate gland and seminal vesicles. No free fluid or suspicious adenopathy. Small fat containing left inguinal hernia. Surgical changes of prior right inguinal hernia repair without evidence of recurrence. Additional ancillary findings as above without significant interval change compared to prior imaging.  Bones/Soft Tissues: No  acute fracture or aggressive appearing lytic or blastic osseous lesion. Stable appearance of L1 compression fracture with approximately 60% height loss anteriorly and 6 mm of posterior bony retropulsion. There is focal kyphosis at this level. Extensive multilevel degenerative disc disease with mild grade 1 anterolisthesis of L4 on L5. Multilevel disc vacuum phenomenon. Extensive anterior spurring.  Vascular: Mild atherosclerotic vascular calcifications. 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 No further imaging followup is required for this abnormality given the marked stability over time.  IMPRESSION: 1. No acute abnormality in the abdomen or pelvis to explain the patient's clinical symptoms. 2. Interval resolution of rectal fecal impaction compared to 06/10/2013. 3. Three small residual polypoid densities are present in the posterior aspect of the rectal vault measuring up to 1 cm in size. These are strongly favored to reflect small nodules of dependently layering fecal material. Additional considerations include internal hemorrhoids and soft tissue polyps. 4. Stable appearance of numerous bilateral renal cysts, some of which are large and exophytic.   Electronically Signed   By: Jacqulynn Cadet M.D.   On: 08/02/2013 14:09     EKG Interpretation None      MDM   Final diagnoses:  GI bleed  Constipation  Fatigue    11:46 AM 80 y.o. male w a hx of hemicolectomy, right inguinal hernia surgery who presents with dark stools and constipation. He states that he had a bowel movement 2 days ago which was soft and small and dark appearing. He notes that he typically can only have bowel movements when he takes a laxative. He notes some chills over the last few days but denies any documented fever. He denies any vomiting. He states that he has a mild prominence in the left inguinal region  which is new. This area is soft and nontender on exam. It does not extend into the scrotum. He has a temp of 100 here, will check rectal temp. vital signs otherwise unremarkable. He denies any pain currently on exam. Will get screening labs and imaging.  Hemeoccult positive on my exam.   Will admit to hospitalist.   Blanchard Kelch, MD 08/02/13 1536

## 2013-08-02 NOTE — Progress Notes (Signed)
Patient arrived to rm 1312 at this time. Alert and orientedx3,c/o generalized pain.Placed comfortably in bed. Sandie Ano RN

## 2013-08-02 NOTE — ED Notes (Signed)
Patient states that he has not had a BM for about 3 days. Daughter states that he has fatigue and dark stools as well.

## 2013-08-03 DIAGNOSIS — I10 Essential (primary) hypertension: Secondary | ICD-10-CM

## 2013-08-03 DIAGNOSIS — R5381 Other malaise: Secondary | ICD-10-CM

## 2013-08-03 DIAGNOSIS — D62 Acute posthemorrhagic anemia: Secondary | ICD-10-CM | POA: Diagnosis present

## 2013-08-03 DIAGNOSIS — R5383 Other fatigue: Secondary | ICD-10-CM

## 2013-08-03 LAB — CBC
HCT: 26.2 % — ABNORMAL LOW (ref 39.0–52.0)
Hemoglobin: 9 g/dL — ABNORMAL LOW (ref 13.0–17.0)
MCH: 32.6 pg (ref 26.0–34.0)
MCHC: 34.4 g/dL (ref 30.0–36.0)
MCV: 94.9 fL (ref 78.0–100.0)
PLATELETS: 182 10*3/uL (ref 150–400)
RBC: 2.76 MIL/uL — ABNORMAL LOW (ref 4.22–5.81)
RDW: 14.3 % (ref 11.5–15.5)
WBC: 7 10*3/uL (ref 4.0–10.5)

## 2013-08-03 LAB — BASIC METABOLIC PANEL
BUN: 17 mg/dL (ref 6–23)
CALCIUM: 8.6 mg/dL (ref 8.4–10.5)
CO2: 27 mEq/L (ref 19–32)
CREATININE: 1.31 mg/dL (ref 0.50–1.35)
Chloride: 104 mEq/L (ref 96–112)
GFR calc Af Amer: 57 mL/min — ABNORMAL LOW (ref >90)
GFR, EST NON AFRICAN AMERICAN: 49 mL/min — AB (ref >90)
GLUCOSE: 89 mg/dL (ref 70–99)
Potassium: 4.4 mEq/L (ref 3.7–5.3)
Sodium: 140 mEq/L (ref 137–147)

## 2013-08-03 LAB — HEMOGLOBIN AND HEMATOCRIT, BLOOD
HEMATOCRIT: 26.3 % — AB (ref 39.0–52.0)
HEMATOCRIT: 25.9 % — AB (ref 39.0–52.0)
Hemoglobin: 9.2 g/dL — ABNORMAL LOW (ref 13.0–17.0)
Hemoglobin: 8.9 g/dL — ABNORMAL LOW (ref 13.0–17.0)

## 2013-08-03 NOTE — Progress Notes (Signed)
Progress Note   Anthony Tyler HGD:[id number] DOB: September 29, 1930 DOA: 08/02/2013 PCP: Donnie Coffin, MD   Brief Narrative:   Anthony Tyler is an 80 y.o. male with a PMH of hypertension, benign cecal adenoma status post colectomy 10/2011, who was admitted 08/02/13 with melanotic stools and weakness. Hemoglobin on admission was 9.8 mg/dL (baseline hemoglobin 14 mg/dL).  Assessment/Plan:   Principal Problem:   GI bleed with acute blood loss anemia in a patient with a history of benign cecal adenoma status post colectomy 10/2011  Continue to monitor hemoglobin closely and transfuse for hemoglobin less than 7 mg/dL. Hemoglobin 9.8---> 9.0.  Seen by Dr. Quitman Livings with plans for EGD 08/04/13.  Continue IV Protonix twice a day.  CT scan of the abdomen and pelvis, done on admission, unremarkable.  Active Problems: Essential hypertension, benign  Continue amlodipine.  DVT Prophylaxis  SCDs ordered.  Code Status: Full. Family Communication: Daughter updated at the bedside. Disposition Plan: Home when stable.   IV Access:    Peripheral IV   Procedures:    None.   Medical Consultants:     Dr. Anson Fret, Gastroenterology   Other Consultants:    None.   Anti-Infectives:    None.  Subjective:   Anthony Tyler is still having black, tarry stools. He continues to feel weak, but no dizziness. No complaints of abdominal pain. Has chronic back pain.  Objective:    Filed Vitals:   08/02/13 2103 08/02/13 2214 08/03/13 0555 08/03/13 0942  BP: 147/85  145/84 121/75  Pulse: 84  74   Temp: 98 F (36.7 C)  97.3 F (36.3 C)   TempSrc: Oral  Oral   Resp: 16  16   Height:  6\' 2"  (1.88 m)    Weight:  82.691 kg (182 lb 4.8 oz)    SpO2: 100%  100%     Intake/Output Summary (Last 24 hours) at 08/03/13 1109 Last data filed at 08/03/13 0937  Gross per 24 hour  Intake    363 ml  Output    451 ml  Net    -88 ml    Exam: Gen:  NAD Cardiovascular:  RRR,  No M/R/G Respiratory:  Lungs CTAB Gastrointestinal:  Abdomen soft, NT/ND, + BS Extremities:  No C/E/C   Data Reviewed:    Labs: Basic Metabolic Panel:  Recent Labs Lab 08/02/13 1220 08/03/13 0358  NA 141 140  K 4.1 4.4  CL 104 104  CO2 25 27  GLUCOSE 94 89  BUN 22 17  CREATININE 1.33 1.31  CALCIUM 9.2 8.6   GFR Estimated Creatinine Clearance: 51.4 ml/min (by C-G formula based on Cr of 1.31). Liver Function Tests:  Recent Labs Lab 08/02/13 1220  AST 14  ALT 9  ALKPHOS 40  BILITOT 0.6  PROT 6.2  ALBUMIN 3.4*    Recent Labs Lab 08/02/13 1220  LIPASE 54   CBC:  Recent Labs Lab 08/02/13 1220 08/03/13 0358  WBC 7.0 7.0  NEUTROABS 3.7  --   HGB 9.8* 9.0*  HCT 28.2* 26.2*  MCV 94.0 94.9  PLT 195 182   Sepsis Labs:  Recent Labs Lab 08/02/13 1220 08/03/13 0358  WBC 7.0 7.0   Microbiology No results found for this or any previous visit (from the past 240 hour(s)).   Radiographs/Studies:   Ct Abdomen Pelvis W Contrast  08/02/2013   CLINICAL DATA:  Periumbilical pain and constipation  EXAM: CT ABDOMEN AND PELVIS WITH CONTRAST  TECHNIQUE: Multidetector CT  imaging of the abdomen and pelvis was performed using the standard protocol following bolus administration of intravenous contrast.  CONTRAST:  26mL OMNIPAQUE IOHEXOL 300 MG/ML SOLN, 125mL OMNIPAQUE IOHEXOL 300 MG/ML SOLN  COMPARISON:  Prior CT abdomen/ pelvis 06/10/2013  FINDINGS: Lower Chest: The lung bases are clear. Visualized cardiac structures are within normal limits for size. No pericardial effusion. Unremarkable visualized distal thoracic esophagus.  Abdomen: Unremarkable CT appearance of the stomach, duodenum, spleen, adrenal glands and pancreas. Normal hepatic contour and morphology. No discrete hepatic lesion. Gallbladder is unremarkable. No intra or extrahepatic biliary ductal dilatation.  Numerous exophytic water attenuation lesions from both kidneys consistent with simple cysts. The largest is  exophytic from the upper pole of the right kidney and measures 1.1 x 9.8 cm. The largest on the left is exophytic from the lower pole and measures 7.6 by 6.5 cm. No hydronephrosis or nephrolithiasis. No solid enhancing renal mass.  Interval resolution of rectal fecal impaction. At least 3 polypoid soft tissue nodules are present in the posterior aspect of the rectum measuring up to 1 cm in size. No significant focal wall thickening or pericolonic inflammation. No evidence of obstruction. Surgical changes of right hemicolectomy. No suspicious adenopathy or free fluid.  Pelvis: Unremarkable bladder, prostate gland and seminal vesicles. No free fluid or suspicious adenopathy. Small fat containing left inguinal hernia. Surgical changes of prior right inguinal hernia repair without evidence of recurrence. Additional ancillary findings as above without significant interval change compared to prior imaging.  Bones/Soft Tissues: No acute fracture or aggressive appearing lytic or blastic osseous lesion. Stable appearance of L1 compression fracture with approximately 60% height loss anteriorly and 6 mm of posterior bony retropulsion. There is focal kyphosis at this level. Extensive multilevel degenerative disc disease with mild grade 1 anterolisthesis of L4 on L5. Multilevel disc vacuum phenomenon. Extensive anterior spurring.  Vascular: Mild atherosclerotic vascular calcifications. 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 No further imaging followup is required for this abnormality given the marked stability over time.  IMPRESSION: 1. No acute abnormality in the abdomen or pelvis to explain the patient's clinical symptoms. 2. Interval resolution of rectal fecal impaction compared to 06/10/2013. 3. Three small residual polypoid densities are present in the posterior aspect of the rectal vault measuring up  to 1 cm in size. These are strongly favored to reflect small nodules of dependently layering fecal material. Additional considerations include internal hemorrhoids and soft tissue polyps. 4. Stable appearance of numerous bilateral renal cysts, some of which are large and exophytic.   Electronically Signed   By: Jacqulynn Cadet M.D.   On: 08/02/2013 14:09    Medications:   . amLODipine  5 mg Oral Daily  . pantoprazole (PROTONIX) IV  40 mg Intravenous Q12H  . polyethylene glycol  17 g Oral Daily  . sodium chloride  3 mL Intravenous Q12H   Continuous Infusions:   Time spent: 35 minutes with > 50% of time discussing current diagnostic test results, clinical impression and plan of care.    LOS: 1 day   [place]  [facility] Pager [phone]. If unable to reach me by pager, please call my cell phone at [phone].  *Please refer to [email or web address], password [email or web address] to get updated schedule on who will round on this patient, as hospitalists switch teams weekly. If 7PM-7AM, please contact night-coverage at [email or web address], password [email or web address]  for any overnight needs.  08/03/2013, 11:09 AM    **Disclaimer: This note was dictated with voice recognition software. Similar sounding words can inadvertently be transcribed and this note may contain transcription errors which may not have been corrected upon publication of note.**   Information printed out and reviewed with the patient/family:     In an effort to keep you and your family informed about your hospital stay, I am providing you with this information sheet. If you or your family have any questions, please do not hesitate to have the nursing staff page me to set up a meeting time.  Also note that the hospitalist doctors typically change on Tuesdays or Wednesdays to a different hospitalist doctor.  Ebon Ketchum Slates 08/03/2013 1 (Number of days in the hospital)  Treatment team:  Dr. Jacquelynn Cree, Hospitalist (Internist)  Dr. Anson Fret, Gastroenterologist  Pertinent labs / studies:  CT scan of the abdomen/pelvis: No acute findings to explain bleeding.  Your blood tests show that you have mild anemia, with your hemoglobin being 9.8 yesterday and 9.0 today.   Plan for today: Dr. Penelope Coop is going to take you for an upper endoscopy to see if he can find the source of bleeding.  Anticipated discharge date: 1-2 days.

## 2013-08-03 NOTE — Consult Note (Signed)
Subjective:   HPI  The patient is an 80 year old male who was admitted to the hospital with complaints of melena which have been going on for the past several days. He saw his primary care physician a few days ago and found to be heme positive. He was referred to our office for an outpatient evaluation that he came to the hospital where he is subsequently being admitted. His hemoglobin today is 9. He denies epigastric pain or heartburn. He has been taking ibuprofen. He gives no history of peptic ulcer disease. He denies hematemesis or bright red rectal bleeding.  He does have a history of a large cecal polyp resected about 2 years ago.  Review of Systems No chest pain or shortness of breath at this time  Past Medical History  Diagnosis Date  . Hypertension   . Glaucoma     BOTH EYES  . Cataract     LEFT EYE  . Partial blindness     RIGHT EYE  . Polyp of colon     DYSPLASIA IN A CECAL  POLYP - FOUND ON COLONOSCOPY  . Enlargement of the nose     OUTER NOSE VERY ENLARGED--STATES HE SOMETIMES MASHES BUMPS ON THE NOSE AND BLOOD WILL COME OUT OF BUMPS.  PT DOES NOT HAVE PCP--SAW A DERMATOLOGIST ONCE--BUT STOPPED THE "PILLS" HE WAS GIVEN-DIDN'T LIKE THE WAY THE MED MADE HIM FEEL  . Hoarse     ON GOING FOR A WHILE  . Warts     ON RIGHT EAR--STATES HE WAS BORN WITH THE WARTS  . Pain     LOWER BACK WITH PROLONGED STANDING  . Sleep apnea 11/03/11    STOP BANG SCORE OF 5   Past Surgical History  Procedure Laterality Date  . Cataract extraction  03/2011    right  . Hernia repair  2008  . Eye surgery      RETINAL SURGERY RT EYE FOR EYE HEMORRHAGE--THEN HAD CATARACT REMOVED RT EYE  . Tonsillectomy      AGE 18  . Hemicolectomy     History   Social History  . Marital Status: Married    Spouse Name: N/A    Number of Children: N/A  . Years of Education: N/A   Occupational History  . Not on file.   Social History Main Topics  . Smoking status: Former Smoker    Quit date: 10/25/1947   . Smokeless tobacco: Never Used  . Alcohol Use: No  . Drug Use: No  . Sexual Activity: Not on file   Other Topics Concern  . Not on file   Social History Narrative  . No narrative on file   family history includes Stroke in his mother. Current facility-administered medications:0.9 %  sodium chloride infusion, 250 mL, Intravenous, PRN, Maryann Mikhail, DO;  acetaminophen (TYLENOL) tablet 500 mg, 500 mg, Oral, Q6H PRN, Maryann Mikhail, DO;  amLODipine (NORVASC) tablet 5 mg, 5 mg, Oral, Daily, Maryann Mikhail, DO, 5 mg at 08/03/13 5035;  HYDROcodone-acetaminophen (NORCO/VICODIN) 5-325 MG per tablet 1-2 tablet, 1-2 tablet, Oral, Q4H PRN, Maryann Mikhail, DO morphine 2 MG/ML injection 2 mg, 2 mg, Intravenous, Q4H PRN, Maryann Mikhail, DO, 2 mg at 08/02/13 1558;  pantoprazole (PROTONIX) injection 40 mg, 40 mg, Intravenous, Q12H, Maryann Mikhail, DO, 40 mg at 08/03/13 0937;  polyethylene glycol (MIRALAX / GLYCOLAX) packet 17 g, 17 g, Oral, Daily, Maryann Mikhail, DO, 17 g at 08/02/13 1658;  sodium chloride 0.9 % injection 3 mL, 3 mL, Intravenous, Q12H,  Maryann Mikhail, DO, 3 mL at 08/03/13 7824 sodium chloride 0.9 % injection 3 mL, 3 mL, Intravenous, PRN, Maryann Mikhail, DO No Known Allergies   Objective:     BP 121/75  Pulse 74  Temp(Src) 97.3 F (36.3 C) (Oral)  Resp 16  Ht 6\' 2"  (1.88 m)  Wt 82.691 kg (182 lb 4.8 oz)  BMI 23.40 kg/m2  SpO2 100%  Is in no distress  Nonicteric  Heart regular rhythm no murmurs  Lungs clear  Abdomen: Bowel sounds normal, soft, nontender  Laboratory No components found with this basename: d1      Assessment:     #1. Melena  #2. Anemia  #3. History of a right colectomy secondary to a cecal adenoma about 2 years ago.He is due for a followup colonoscopy later this year      Plan:     I will schedule him for an EGD tomorrow to evaluate the upper GI tract. Continue PPI therapy.

## 2013-08-04 ENCOUNTER — Encounter (HOSPITAL_COMMUNITY): Payer: Self-pay | Admitting: *Deleted

## 2013-08-04 ENCOUNTER — Encounter (HOSPITAL_COMMUNITY)
Admission: EM | Disposition: A | Discharge status: Home / Self Care | Payer: Self-pay | Source: Home / Self Care | Attending: Internal Medicine

## 2013-08-04 DIAGNOSIS — K279 Peptic ulcer, site unspecified, unspecified as acute or chronic, without hemorrhage or perforation: Secondary | ICD-10-CM

## 2013-08-04 HISTORY — PX: ESOPHAGOGASTRODUODENOSCOPY: SHX5428

## 2013-08-04 LAB — HEMOGLOBIN AND HEMATOCRIT, BLOOD
HCT: 25.2 % — ABNORMAL LOW (ref 39.0–52.0)
HCT: 25.6 % — ABNORMAL LOW (ref 39.0–52.0)
Hemoglobin: 8.6 g/dL — ABNORMAL LOW (ref 13.0–17.0)
Hemoglobin: 8.7 g/dL — ABNORMAL LOW (ref 13.0–17.0)

## 2013-08-04 SURGERY — EGD (ESOPHAGOGASTRODUODENOSCOPY)
Anesthesia: Moderate Sedation

## 2013-08-04 MED ORDER — MIDAZOLAM HCL 10 MG/2ML IJ SOLN
INTRAMUSCULAR | Status: AC
  Filled 2013-08-04: qty 2

## 2013-08-04 MED ORDER — MIDAZOLAM HCL 10 MG/2ML IJ SOLN
INTRAMUSCULAR | Status: DC | PRN
  Administered 2013-08-04: 2 mg via INTRAVENOUS
  Administered 2013-08-04: 1 mg via INTRAVENOUS

## 2013-08-04 MED ORDER — FENTANYL CITRATE 0.05 MG/ML IJ SOLN
INTRAMUSCULAR | Status: DC | PRN
  Administered 2013-08-04: 25 ug via INTRAVENOUS
  Administered 2013-08-04: 12.5 ug via INTRAVENOUS

## 2013-08-04 MED ORDER — DIPHENHYDRAMINE HCL 50 MG/ML IJ SOLN
INTRAMUSCULAR | Status: AC
  Filled 2013-08-04: qty 1

## 2013-08-04 MED ORDER — SODIUM CHLORIDE 0.9 % IV SOLN: INTRAVENOUS | Status: DC

## 2013-08-04 MED ORDER — BUTAMBEN-TETRACAINE-BENZOCAINE 2-2-14 % EX AERO
INHALATION_SPRAY | CUTANEOUS | Status: DC | PRN
  Administered 2013-08-04: 2 via TOPICAL

## 2013-08-04 MED ORDER — FENTANYL CITRATE 0.05 MG/ML IJ SOLN
INTRAMUSCULAR | Status: AC
  Filled 2013-08-04: qty 2

## 2013-08-04 NOTE — Progress Notes (Signed)
Nutrition Note  Patient screened for positive Malnutrition Screening Tool. Attempted to meet with patient to discuss food/nutrition hx. Was in EGD, plan to perform nutrition assessment tomorrow  Please consult as needed.  Atlee Abide MS RD LDN Clinical Dietitian [phone]

## 2013-08-04 NOTE — Progress Notes (Signed)
420 ml showed up during patient's bladder scan. He was able to void 333ml after scan.

## 2013-08-04 NOTE — Progress Notes (Signed)
Progress Note   Anthony Tyler ERD:[id number] DOB: [birth year] DOA: 08/02/2013 PCP: Donnie Coffin, MD   Brief Narrative:   Anthony Tyler is an 80 y.o. male with a PMH of hypertension, benign cecal adenoma status post colectomy 10/2011, who was admitted 08/02/13 with melanotic stools and weakness. Hemoglobin on admission was 9.8 mg/dL (baseline hemoglobin 14 mg/dL).  Assessment/Plan:   Principal Problem:   GI bleed secondary to peptic ulcer disease with acute blood loss anemia in a patient with a history of benign cecal adenoma status post colectomy 10/2011  Continue to monitor hemoglobin closely and transfuse for hemoglobin less than 7 mg/dL. Hemoglobin slowly trending down, 8.6 this morning.  EGD done 08/04/13 which showed an antral and duodenal ulcer with no active bleeding.  Continue IV Protonix twice a day. Avoid NSAIDs.  Followup biopsies to check for H. pylori.  Active Problems: Essential hypertension, benign  Continue amlodipine.  DVT Prophylaxis  SCDs ordered.  Code Status: Full. Family Communication: Multiple family updated at the bedside. Disposition Plan: Home when stable.   IV Access:    Peripheral IV   Procedures:    None.   Medical Consultants:     Dr. Anson Fret, Gastroenterology   Other Consultants:    None.   Anti-Infectives:    None.  Subjective:   Anthony Tyler feels okay except for a "knot "in his suprapubic area. No nausea or vomiting. No further complaints of black stools. Energy level is fair.  Objective:    Filed Vitals:   08/03/13 0942 08/03/13 1400 08/03/13 2037 08/04/13 0602  BP: 121/75 107/64 141/92 152/86  Pulse:  75 71 83  Temp:  98.5 F (36.9 C) 98.2 F (36.8 C) 98.3 F (36.8 C)  TempSrc:  Oral Oral Oral  Resp:  16 16 16   Height:      Weight:      SpO2:  99% 100% 100%    Intake/Output Summary (Last 24 hours) at 08/04/13 0818 Last data filed at 08/03/13 1802  Gross per 24 hour  Intake    1843 ml  Output      0 ml  Net   1843 ml    Exam: Gen:  NAD Cardiovascular:  RRR, No M/R/G Respiratory:  Lungs CTAB Gastrointestinal:  Abdomen soft except for some suprapubic firmness, NT/ND, + BS Extremities:  No C/E/C   Data Reviewed:    Labs: Basic Metabolic Panel:  Recent Labs Lab 08/02/13 1220 08/03/13 0358  NA 141 140  K 4.1 4.4  CL 104 104  CO2 25 27  GLUCOSE 94 89  BUN 22 17  CREATININE 1.33 1.31  CALCIUM 9.2 8.6   GFR Estimated Creatinine Clearance: 51.4 ml/min (by C-G formula based on Cr of 1.31). Liver Function Tests:  Recent Labs Lab 08/02/13 1220  AST 14  ALT 9  ALKPHOS 40  BILITOT 0.6  PROT 6.2  ALBUMIN 3.4*    Recent Labs Lab 08/02/13 1220  LIPASE 54   CBC:  Recent Labs Lab 08/02/13 1220 08/03/13 0358 08/03/13 1200 08/03/13 1948 08/04/13 0408  WBC 7.0 7.0  --   --   --   NEUTROABS 3.7  --   --   --   --   HGB 9.8* 9.0* 9.2* 8.9* 8.6*  HCT 28.2* 26.2* 26.3* 25.9* 25.2*  MCV 94.0 94.9  --   --   --   PLT 195 182  --   --   --    Sepsis Labs:  Recent Labs Lab 08/02/13 1220 08/03/13 0358  WBC 7.0 7.0   Microbiology No results found for this or any previous visit (from the past 240 hour(s)).   Radiographs/Studies:   Ct Abdomen Pelvis W Contrast  08/02/2013   CLINICAL DATA:  Periumbilical pain and constipation  EXAM: CT ABDOMEN AND PELVIS WITH CONTRAST  TECHNIQUE: Multidetector CT imaging of the abdomen and pelvis was performed using the standard protocol following bolus administration of intravenous contrast.  CONTRAST:  51mL OMNIPAQUE IOHEXOL 300 MG/ML SOLN, 185mL OMNIPAQUE IOHEXOL 300 MG/ML SOLN  COMPARISON:  Prior CT abdomen/ pelvis 06/10/2013  FINDINGS: Lower Chest: The lung bases are clear. Visualized cardiac structures are within normal limits for size. No pericardial effusion. Unremarkable visualized distal thoracic esophagus.  Abdomen: Unremarkable CT appearance of the stomach, duodenum, spleen, adrenal glands and  pancreas. Normal hepatic contour and morphology. No discrete hepatic lesion. Gallbladder is unremarkable. No intra or extrahepatic biliary ductal dilatation.  Numerous exophytic water attenuation lesions from both kidneys consistent with simple cysts. The largest is exophytic from the upper pole of the right kidney and measures 1.1 x 9.8 cm. The largest on the left is exophytic from the lower pole and measures 7.6 by 6.5 cm. No hydronephrosis or nephrolithiasis. No solid enhancing renal mass.  Interval resolution of rectal fecal impaction. At least 3 polypoid soft tissue nodules are present in the posterior aspect of the rectum measuring up to 1 cm in size. No significant focal wall thickening or pericolonic inflammation. No evidence of obstruction. Surgical changes of right hemicolectomy. No suspicious adenopathy or free fluid.  Pelvis: Unremarkable bladder, prostate gland and seminal vesicles. No free fluid or suspicious adenopathy. Small fat containing left inguinal hernia. Surgical changes of prior right inguinal hernia repair without evidence of recurrence. Additional ancillary findings as above without significant interval change compared to prior imaging.  Bones/Soft Tissues: No acute fracture or aggressive appearing lytic or blastic osseous lesion. Stable appearance of L1 compression fracture with approximately 60% height loss anteriorly and 6 mm of posterior bony retropulsion. There is focal kyphosis at this level. Extensive multilevel degenerative disc disease with mild grade 1 anterolisthesis of L4 on L5. Multilevel disc vacuum phenomenon. Extensive anterior spurring.  Vascular: Mild atherosclerotic vascular calcifications. 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 No further imaging followup is required for this abnormality given the marked stability over time.  IMPRESSION: 1. No acute  abnormality in the abdomen or pelvis to explain the patient's clinical symptoms. 2. Interval resolution of rectal fecal impaction compared to 06/10/2013. 3. Three small residual polypoid densities are present in the posterior aspect of the rectal vault measuring up to 1 cm in size. These are strongly favored to reflect small nodules of dependently layering fecal material. Additional considerations include internal hemorrhoids and soft tissue polyps. 4. Stable appearance of numerous bilateral renal cysts, some of which are large and exophytic.   Electronically Signed   By: Jacqulynn Cadet M.D.   On: 08/02/2013 14:09    Medications:   . amLODipine  5 mg Oral Daily  . pantoprazole (PROTONIX) IV  40 mg Intravenous Q12H  . polyethylene glycol  17 g Oral Daily  . sodium chloride  3 mL Intravenous Q12H   Continuous Infusions:   Time spent: 25 minutes.    LOS: 2 days   [place]  [facility] Pager [phone]. If  unable to reach me by pager, please call my cell phone at [phone].  *Please refer to [email or web address], password [email or web address] to get updated schedule on who will round on this patient, as hospitalists switch teams weekly. If 7PM-7AM, please contact night-coverage at [email or web address], password [email or web address] for any overnight needs.  08/04/2013, 8:18 AM    **Disclaimer: This note was dictated with voice recognition software. Similar sounding words can inadvertently be transcribed and this note may contain transcription errors which may not have been corrected upon publication of note.**   Information printed out and reviewed with the patient/family:     In an effort to keep you and your family informed about your hospital stay, I am providing you with this information sheet. If you or your family have any questions, please do not hesitate to have the nursing staff page me to set up a meeting time.  Also note that the hospitalist doctors typically change on Tuesdays or Wednesdays to a different  hospitalist doctor.  Anthony Tyler 08/04/2013 2 (Number of days in the hospital)  Treatment team:  Dr. Jacquelynn Cree, Hospitalist (Internist)  Dr. Anson Fret, Gastroenterologist  Pertinent labs / studies:  CT scan of the abdomen/pelvis: No acute findings to explain bleeding.  Your blood tests show that you have mild anemia, with your hemoglobin being 9.0 yesterday and 8.6 today.   Your upper endoscopy showed ulcers in your stomach and the first part of your small intestine.  Plan for today: Continue acid suppressive therapy. Avoid over-the-counter pain relievers other than Tylenol.  Anticipated discharge date: Today or tomorrow.

## 2013-08-04 NOTE — Op Note (Signed)
[facility] [place], [id number]   ENDOSCOPY PROCEDURE REPORT  PATIENT: Anthony, Tyler  MR#: [id number] BIRTHDATE: Oct 28, 1930 , 83  yrs. old GENDER: Male ENDOSCOPIST: Acquanetta Sit, MD REFERRED BY: PROCEDURE DATE:  08/04/2013 PROCEDURE:   EGD ASA CLASS: 2 INDICATIONS: melena MEDICATIONS: Fentanyl 37.87mcg iv, Versed 3mg  iv TOPICAL ANESTHETIC:  DESCRIPTION OF PROCEDURE:   After the risks benefits and alternatives of the procedure were thoroughly explained, informed consent was obtained.  The Pentax Gastroscope [id number]  endoscope was introduced through the mouth and advanced to the 2nd portion of duodenum      , limited by Without limitations.   The instrument was slowly withdrawn as the mucosa was fully examined.      FINDINGS:  Esophagus: normal  Stomach: 67mm antral ulcer. Biopsied. No active bleeding.  Duodenum. 61mm linear ulcer in the bulb. No active bleeding.  COMPLICATIONS: none  ENDOSCOPIC IMPRESSION: See above   RECOMMENDATIONS: PPI therapy. Avoid NSAIDS. Check biopsy for H pylori.   REPEAT EXAM:   _______________________________ Lorrin MaisAcquanetta Sit, MD 08/04/2013 10:45 AM

## 2013-08-05 ENCOUNTER — Encounter (HOSPITAL_COMMUNITY): Payer: Self-pay | Admitting: Gastroenterology

## 2013-08-05 LAB — CBC
HEMATOCRIT: 24.7 % — AB (ref 39.0–52.0)
HEMOGLOBIN: 8.5 g/dL — AB (ref 13.0–17.0)
MCH: 32.7 pg (ref 26.0–34.0)
MCHC: 34.4 g/dL (ref 30.0–36.0)
MCV: 95 fL (ref 78.0–100.0)
Platelets: 195 10*3/uL (ref 150–400)
RBC: 2.6 MIL/uL — ABNORMAL LOW (ref 4.22–5.81)
RDW: 14.4 % (ref 11.5–15.5)
WBC: 7.2 10*3/uL (ref 4.0–10.5)

## 2013-08-05 MED ORDER — HYDROCODONE-ACETAMINOPHEN 5-325 MG PO TABS: 1.0000 | ORAL_TABLET | ORAL | Status: DC | PRN

## 2013-08-05 MED ORDER — PANTOPRAZOLE SODIUM 40 MG PO TBEC
40.0000 mg | DELAYED_RELEASE_TABLET | Freq: Every day | ORAL | Status: DC
Start: 2013-08-05 — End: 2015-01-14

## 2013-08-05 NOTE — Discharge Summary (Addendum)
Physician Discharge Summary  TARRIS DELBENE NLG:[id number] DOB: 1930/10/09 DOA: 08/02/2013  PCP: Donnie Coffin, MD  Admit date: 08/02/2013 Discharge date: 08/05/2013   Recommendations for Outpatient Follow-Up:   1. Check CBC in 3 days and follow up biopsy results.   Discharge Diagnosis:   Principal Problem:    GI bleed Active Problems:    Benign Cecal Adenoma-Lap Asst Right Colectomy September 2013    Essential hypertension, benign    Acute blood loss anemia    Peptic ulcer disease  Discharge Condition: Improved.  Diet recommendation: Low sodium, heart healthy.     History of Present Illness:   Anthony Tyler is an 80 y.o. male with a PMH of hypertension, benign cecal adenoma status post colectomy 10/2011, who was admitted 08/02/13 with melanotic stools and weakness. Hemoglobin on admission was 9.8 mg/dL (baseline hemoglobin 14 mg/dL).  Hospital Course by Problem:   Principal Problem:  GI bleed secondary to peptic ulcer disease with acute blood loss anemia in a patient with a history of benign cecal adenoma status post colectomy 10/2011  The patient's hemoglobin was monitored closely, and has been stable for the past 24 hours.  EGD done 08/04/13 which showed an antral and duodenal ulcer with no active bleeding.  Continue Protonix twice a day. Avoid NSAIDs.  Followup biopsies to check for H. pylori.  Active Problems:  Essential hypertension, benign  Continue amlodipine.    Procedures:    EGD 08/04/13:12 mm antral ulcer which was biopsied with no active bleeding. 8 mm duodenal linear ulcer in the bulb with no active bleeding. Biopsies sent for H. pylori testing.   Medical Consultants:    Dr. Anson Fret, Gastroenterology   Discharge Exam:   Filed Vitals:   08/05/13 0549  BP: 138/76  Pulse: 77  Temp: 97.9 F (36.6 C)  Resp: 16   Filed Vitals:   08/04/13 1100 08/04/13 1422 08/04/13 2111 08/05/13 0549  BP: 125/79 106/64 130/69 138/76  Pulse: 68 84 70  77  Temp:  97.6 F (36.4 C) 97.8 F (36.6 C) 97.9 F (36.6 C)  TempSrc:  Axillary Oral Oral  Resp: 15 16 18 16   Height:      Weight:      SpO2: 96% 99% 99% 99%    Gen:  NAD Cardiovascular:  RRR, No M/R/G Respiratory: Lungs CTAB Gastrointestinal: Abdomen soft, NT/ND with normal active bowel sounds. Extremities: No C/E/C    Discharge Instructions:   Discharge Instructions   Call MD for:  difficulty breathing, headache or visual disturbances    Complete by:  As directed      Call MD for:  extreme fatigue    Complete by:  As directed      Call MD for:  persistant dizziness or light-headedness    Complete by:  As directed      Call MD for:    Complete by:  As directed   Bloody stools or persistent black stools.     Diet - low sodium heart healthy    Complete by:  As directed      Discharge instructions    Complete by:  As directed   Make sure you avoid any over the counter pain relievers other than tylenol.  Have your primary care doctor recheck your blood count on Friday.  You were cared for by Dr. Jacquelynn Cree  (a hospitalist) during your hospital stay. If you have any questions about your discharge medications or the care you received while you were  in the hospital after you are discharged, you can call the unit and ask to speak with the hospitalist on call if the hospitalist that took care of you is not available. Once you are discharged, your primary care physician will handle any further medical issues. Please note that NO REFILLS for any discharge medications will be authorized once you are discharged, as it is imperative that you return to your primary care physician (or establish a relationship with a primary care physician if you do not have one) for your aftercare needs so that they can reassess your need for medications and monitor your lab values.  Any outstanding tests can be reviewed by your PCP at your follow up visit.  It is also important to review any medicine  changes with your PCP.  Please bring these d/c instructions with you to your next visit so your physician can review these changes with you.  If you do not have a primary care physician, you can call [phone] for a physician referral.  It is highly recommended that you obtain a PCP for hospital follow up.     Increase activity slowly    Complete by:  As directed             Medication List    STOP taking these medications       esomeprazole 20 MG capsule  Commonly known as:  [place] these medications       acetaminophen 500 MG tablet  Commonly known as:  TYLENOL  Take 500 mg by mouth every 6 (six) hours as needed for pain. PAIN     amLODipine 5 MG tablet  Commonly known as:  NORVASC  Take 5 mg by mouth daily.     HYDROcodone-acetaminophen 5-325 MG per tablet  Commonly known as:  NORCO/VICODIN  Take 1-2 tablets by mouth every 4 (four) hours as needed for moderate pain.     MUSCLE RUB 10-15 % Crea  Apply 1 application topically as needed for muscle pain.     pantoprazole 40 MG tablet  Commonly known as:  PROTONIX  Take 1 tablet (40 mg total) by mouth daily.     polyethylene glycol packet  Commonly known as:  MIRALAX / GLYCOLAX  Take 17 g by mouth daily.           Follow-up Information   Follow up with [facility], MD In 3 days. (For a repeat check of your blood counts and to follow up on your biopsy results.)    Specialty:  Family Medicine   Contact information:   [address] [place] [address] [phone]        The results of significant diagnostics from this hospitalization (including imaging, microbiology, ancillary and laboratory) are listed below for reference.     Significant Diagnostic Studies:   Radiographs: Ct Abdomen Pelvis W Contrast  08/02/2013   CLINICAL DATA:  Periumbilical pain and constipation  EXAM: CT ABDOMEN AND PELVIS WITH CONTRAST  TECHNIQUE: Multidetector CT imaging of the abdomen and pelvis was  performed using the standard protocol following bolus administration of intravenous contrast.  CONTRAST:  26mL OMNIPAQUE IOHEXOL 300 MG/ML SOLN, 151mL OMNIPAQUE IOHEXOL 300 MG/ML SOLN  COMPARISON:  Prior CT abdomen/ pelvis 06/10/2013  FINDINGS: Lower Chest: The lung bases are clear. Visualized cardiac structures are within normal limits for size. No pericardial effusion. Unremarkable visualized distal thoracic esophagus.  Abdomen: Unremarkable CT appearance of the stomach, duodenum, spleen, adrenal  glands and pancreas. Normal hepatic contour and morphology. No discrete hepatic lesion. Gallbladder is unremarkable. No intra or extrahepatic biliary ductal dilatation.  Numerous exophytic water attenuation lesions from both kidneys consistent with simple cysts. The largest is exophytic from the upper pole of the right kidney and measures 1.1 x 9.8 cm. The largest on the left is exophytic from the lower pole and measures 7.6 by 6.5 cm. No hydronephrosis or nephrolithiasis. No solid enhancing renal mass.  Interval resolution of rectal fecal impaction. At least 3 polypoid soft tissue nodules are present in the posterior aspect of the rectum measuring up to 1 cm in size. No significant focal wall thickening or pericolonic inflammation. No evidence of obstruction. Surgical changes of right hemicolectomy. No suspicious adenopathy or free fluid.  Pelvis: Unremarkable bladder, prostate gland and seminal vesicles. No free fluid or suspicious adenopathy. Small fat containing left inguinal hernia. Surgical changes of prior right inguinal hernia repair without evidence of recurrence. Additional ancillary findings as above without significant interval change compared to prior imaging.  Bones/Soft Tissues: No acute fracture or aggressive appearing lytic or blastic osseous lesion. Stable appearance of L1 compression fracture with approximately 60% height loss anteriorly and 6 mm of posterior bony retropulsion. There is focal kyphosis  at this level. Extensive multilevel degenerative disc disease with mild grade 1 anterolisthesis of L4 on L5. Multilevel disc vacuum phenomenon. Extensive anterior spurring.  Vascular: Mild atherosclerotic vascular calcifications. 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 No further imaging followup is required for this abnormality given the marked stability over time.  IMPRESSION: 1. No acute abnormality in the abdomen or pelvis to explain the patient's clinical symptoms. 2. Interval resolution of rectal fecal impaction compared to 06/10/2013. 3. Three small residual polypoid densities are present in the posterior aspect of the rectal vault measuring up to 1 cm in size. These are strongly favored to reflect small nodules of dependently layering fecal material. Additional considerations include internal hemorrhoids and soft tissue polyps. 4. Stable appearance of numerous bilateral renal cysts, some of which are large and exophytic.   Electronically Signed   By: Jacqulynn Cadet M.D.   On: 08/02/2013 14:09    Labs:  Basic Metabolic Panel:  Recent Labs Lab 08/02/13 1220 08/03/13 0358  NA 141 140  K 4.1 4.4  CL 104 104  CO2 25 27  GLUCOSE 94 89  BUN 22 17  CREATININE 1.33 1.31  CALCIUM 9.2 8.6   GFR Estimated Creatinine Clearance: 51.4 ml/min (by C-G formula based on Cr of 1.31). Liver Function Tests:  Recent Labs Lab 08/02/13 1220  AST 14  ALT 9  ALKPHOS 40  BILITOT 0.6  PROT 6.2  ALBUMIN 3.4*    Recent Labs Lab 08/02/13 1220  LIPASE 54   CBC:  Recent Labs Lab 08/02/13 1220 08/03/13 0358 08/03/13 1200 08/03/13 1948 08/04/13 0408 08/04/13 1119 08/05/13 0428  WBC 7.0 7.0  --   --   --   --  7.2  NEUTROABS 3.7  --   --   --   --   --   --   HGB 9.8* 9.0* 9.2* 8.9* 8.6* 8.7* 8.5*  HCT 28.2* 26.2* 26.3* 25.9* 25.2* 25.6* 24.7*  MCV 94.0 94.9  --   --    --   --  95.0  PLT 195 182  --   --   --   --  195    Time coordinating discharge: 35 minutes.  SignedVenetia Maxon Jashad Depaula  Pager [phone] [facility] 08/05/2013, 10:42 AM

## 2013-08-05 NOTE — Progress Notes (Signed)
Pt discharged to home. DC instructions given with wife at bedside. No concerns voiced. Prescription x 1 given for pain med. Left unit in wheelchair pushed by nurse tech. Left in good condition. Vwilliams,rn.

## 2013-08-05 NOTE — Discharge Instructions (Signed)
Peptic Ulcer A peptic ulcer is a painful sore in the lining of in your esophagus, stomach, or in the first part of your small intestine. The main causes of an ulcer can be:  An infection.  Using certain pain medicines too often or too much.  Smoking. HOME CARE  Avoid smoking, alcohol, and caffeine.  Avoid foods that bother you.  Only take medicine as told by your doctor. Do not take any medicines your doctor has not approved.  Keep all doctor visits as told. GET HELP RIGHT AWAY IF:  You do not get better in 7 days after starting treatment.  You keep having an upset stomach (indigestion) or heartburn.  You have sudden, sharp, or lasting belly (abdominal) pain.  You have bloody, black, or tarry poop (stool).  You throw up (vomit) blood or your throw up looks like coffee grounds.  You get light headed, weak, or feel like you will pass out (faint).  You get sweaty or feel sticky and cold to the touch (clammy). MAKE SURE YOU:   Understand these instructions.  Will watch your condition.  Will get help right away if you are not doing well or get worse. Document Released: 05/10/2009 Document Revised: 11/08/2011 Document Reviewed: 09/13/2011 [facility] Patient Information 2014 [place], [place].

## 2015-01-11 ENCOUNTER — Other Ambulatory Visit: Payer: Self-pay | Admitting: Neurological Surgery

## 2015-01-18 ENCOUNTER — Other Ambulatory Visit: Payer: Self-pay

## 2015-01-18 ENCOUNTER — Other Ambulatory Visit (HOSPITAL_COMMUNITY): Payer: Medicare HMO

## 2015-01-18 ENCOUNTER — Encounter (HOSPITAL_COMMUNITY)
Admission: RE | Admit: 2015-01-18 | Discharge: 2015-01-18 | Disposition: A | Discharge status: Home / Self Care | Payer: Commercial Managed Care - HMO | Source: Ambulatory Visit | Attending: Neurological Surgery | Admitting: Neurological Surgery

## 2015-01-18 ENCOUNTER — Encounter (HOSPITAL_COMMUNITY): Payer: Self-pay

## 2015-01-18 DIAGNOSIS — M4806 Spinal stenosis, lumbar region: Secondary | ICD-10-CM | POA: Insufficient documentation

## 2015-01-18 DIAGNOSIS — Z01812 Encounter for preprocedural laboratory examination: Secondary | ICD-10-CM | POA: Insufficient documentation

## 2015-01-18 DIAGNOSIS — Z01818 Encounter for other preprocedural examination: Secondary | ICD-10-CM | POA: Insufficient documentation

## 2015-01-18 DIAGNOSIS — R9431 Abnormal electrocardiogram [ECG] [EKG]: Secondary | ICD-10-CM | POA: Diagnosis not present

## 2015-01-18 DIAGNOSIS — I440 Atrioventricular block, first degree: Secondary | ICD-10-CM | POA: Diagnosis not present

## 2015-01-18 HISTORY — DX: Other chronic pain: G89.29

## 2015-01-18 HISTORY — DX: Unilateral inguinal hernia, without obstruction or gangrene, not specified as recurrent: K40.90

## 2015-01-18 HISTORY — DX: Edema, unspecified: R60.9

## 2015-01-18 HISTORY — DX: Unspecified hemorrhoids: K64.9

## 2015-01-18 HISTORY — DX: Weakness: R53.1

## 2015-01-18 HISTORY — DX: Dorsalgia, unspecified: M54.9

## 2015-01-18 HISTORY — DX: Alzheimer's disease, unspecified: G30.9

## 2015-01-18 HISTORY — DX: Frequency of micturition: R35.0

## 2015-01-18 HISTORY — DX: Dry eye syndrome of bilateral lacrimal glands: H04.123

## 2015-01-18 HISTORY — DX: Urgency of urination: R39.15

## 2015-01-18 HISTORY — DX: Dementia in other diseases classified elsewhere, unspecified severity, without behavioral disturbance, psychotic disturbance, mood disturbance, and anxiety: F02.80

## 2015-01-18 LAB — BASIC METABOLIC PANEL
ANION GAP: 7 (ref 5–15)
BUN: 12 mg/dL (ref 6–20)
CALCIUM: 9 mg/dL (ref 8.9–10.3)
CO2: 27 mmol/L (ref 22–32)
Chloride: 106 mmol/L (ref 101–111)
Creatinine, Ser: 1.3 mg/dL — ABNORMAL HIGH (ref 0.61–1.24)
GFR, EST AFRICAN AMERICAN: 57 mL/min — AB (ref >60)
GFR, EST NON AFRICAN AMERICAN: 49 mL/min — AB (ref >60)
Glucose, Bld: 95 mg/dL (ref 65–99)
POTASSIUM: 3.7 mmol/L (ref 3.5–5.1)
SODIUM: 140 mmol/L (ref 135–145)

## 2015-01-18 LAB — CBC
HEMATOCRIT: 40.7 % (ref 39.0–52.0)
HEMOGLOBIN: 13.5 g/dL (ref 13.0–17.0)
MCH: 33 pg (ref 26.0–34.0)
MCHC: 33.2 g/dL (ref 30.0–36.0)
MCV: 99.5 fL (ref 78.0–100.0)
Platelets: 164 10*3/uL (ref 150–400)
RBC: 4.09 MIL/uL — ABNORMAL LOW (ref 4.22–5.81)
RDW: 13.2 % (ref 11.5–15.5)
WBC: 7.6 10*3/uL (ref 4.0–10.5)

## 2015-01-18 LAB — SURGICAL PCR SCREEN
MRSA, PCR: NEGATIVE
STAPHYLOCOCCUS AUREUS: NEGATIVE

## 2015-01-18 NOTE — Progress Notes (Addendum)
Cardiologist denies having one  Medical Md is Dr.Dean Alroy Dust  Echo/stress test done several yrs  ago  Heart cath denies ever having one  EKG denies having in past yr  CXR denies having one in the past yr

## 2015-01-18 NOTE — Pre-Procedure Instructions (Signed)
Anthony Tyler  01/18/2015      [address], [place]  [address] Phone: [phone] Fax: [phone]    Your procedure is scheduled on Mon, Nov 28 @ 2:25 PM  Report to [facility] Admitting at 11:15 AM  Call this number if you have problems the morning of surgery:  [phone]   Remember:  Do not eat food or drink liquids after midnight.  Take these medicines the morning of surgery with A SIP OF WATER Amlodipine(Norvasc)             No Goody's,BC's,Aleve,Aspirin,Ibuprofen,Motrin,Advil,Fish Oil,or any Herbal Medications.    Do not wear jewelry.  Do not wear lotions, powders, or colognes.  You may wear deodorant.  Men may shave face and neck.  Do not bring valuables to the hospital.  [facility] is not responsible for any belongings or valuables.  Contacts, dentures or bridgework may not be worn into surgery.  Leave your suitcase in the car.  After surgery it may be brought to your room.  For patients admitted to the hospital, discharge time will be determined by your treatment team.  Patients discharged the day of surgery will not be allowed to drive home.    Special instructions:  [place] - Preparing for Surgery  Before surgery, you can play an important role.  Because skin is not sterile, your skin needs to be as free of germs as possible.  You can reduce the number of germs on you skin by washing with CHG (chlorahexidine gluconate) soap before surgery.  CHG is an antiseptic cleaner which kills germs and bonds with the skin to continue killing germs even after washing.  Please DO NOT use if you have an allergy to CHG or antibacterial soaps.  If your skin becomes reddened/irritated stop using the CHG and inform your nurse when you arrive at Short Stay.  Do not shave (including legs and underarms) for at least 48 hours prior to the first CHG shower.  You may shave your  face.  Please follow these instructions carefully:   1.  Shower with CHG Soap the night before surgery and the                                morning of Surgery.  2.  If you choose to wash your hair, wash your hair first as usual with your       normal shampoo.  3.  After you shampoo, rinse your hair and body thoroughly to remove the                      Shampoo.  4.  Use CHG as you would any other liquid soap.  You can apply chg directly       to the skin and wash gently with scrungie or a clean washcloth.  5.  Apply the CHG Soap to your body ONLY FROM THE NECK DOWN.        Do not use on open wounds or open sores.  Avoid contact with your eyes,       ears, mouth and genitals (private parts).  Wash genitals (private parts)       with your normal soap.  6.  Wash thoroughly, paying special attention to the area where your surgery  will be performed.  7.  Thoroughly rinse your body with warm water from the neck down.  8.  DO NOT shower/wash with your normal soap after using and rinsing off       the CHG Soap.  9.  Pat yourself dry with a clean towel.            10.  Wear clean pajamas.            11.  Place clean sheets on your bed the night of your first shower and do not        sleep with pets.  Day of Surgery  Do not apply any lotions/deoderants the morning of surgery.  Please wear clean clothes to the hospital/surgery center.    Please read over the following fact sheets that you were given. Pain Booklet, Coughing and Deep Breathing, MRSA Information and Surgical Site Infection Prevention

## 2015-01-24 MED ORDER — CEFAZOLIN SODIUM-DEXTROSE 2-3 GM-% IV SOLR
2.0000 g | INTRAVENOUS | Status: AC
  Administered 2015-01-25: 2 g via INTRAVENOUS
  Filled 2015-01-24: qty 50

## 2015-01-25 ENCOUNTER — Inpatient Hospital Stay (HOSPITAL_COMMUNITY): Payer: Commercial Managed Care - HMO | Admitting: Anesthesiology

## 2015-01-25 ENCOUNTER — Inpatient Hospital Stay (HOSPITAL_COMMUNITY)
Admission: RE | Admit: 2015-01-25 | Discharge: 2015-01-30 | DRG: 516 | Disposition: A | Discharge status: Home Health | Payer: Commercial Managed Care - HMO | Source: Ambulatory Visit | Attending: Neurological Surgery | Admitting: Neurological Surgery

## 2015-01-25 ENCOUNTER — Encounter (HOSPITAL_COMMUNITY)
Admission: RE | Disposition: A | Discharge status: Home Health | Payer: Self-pay | Source: Ambulatory Visit | Attending: Neurological Surgery

## 2015-01-25 ENCOUNTER — Inpatient Hospital Stay (HOSPITAL_COMMUNITY): Payer: Commercial Managed Care - HMO

## 2015-01-25 ENCOUNTER — Encounter (HOSPITAL_COMMUNITY): Payer: Self-pay | Admitting: *Deleted

## 2015-01-25 DIAGNOSIS — K5900 Constipation, unspecified: Secondary | ICD-10-CM | POA: Diagnosis not present

## 2015-01-25 DIAGNOSIS — I493 Ventricular premature depolarization: Secondary | ICD-10-CM | POA: Diagnosis not present

## 2015-01-25 DIAGNOSIS — R410 Disorientation, unspecified: Secondary | ICD-10-CM | POA: Diagnosis not present

## 2015-01-25 DIAGNOSIS — H409 Unspecified glaucoma: Secondary | ICD-10-CM | POA: Diagnosis present

## 2015-01-25 DIAGNOSIS — Z9842 Cataract extraction status, left eye: Secondary | ICD-10-CM | POA: Diagnosis not present

## 2015-01-25 DIAGNOSIS — I10 Essential (primary) hypertension: Secondary | ICD-10-CM | POA: Diagnosis present

## 2015-01-25 DIAGNOSIS — M4726 Other spondylosis with radiculopathy, lumbar region: Secondary | ICD-10-CM | POA: Diagnosis present

## 2015-01-25 DIAGNOSIS — Z823 Family history of stroke: Secondary | ICD-10-CM | POA: Diagnosis not present

## 2015-01-25 DIAGNOSIS — H540 Blindness, both eyes: Secondary | ICD-10-CM | POA: Diagnosis present

## 2015-01-25 DIAGNOSIS — Z9841 Cataract extraction status, right eye: Secondary | ICD-10-CM | POA: Diagnosis not present

## 2015-01-25 DIAGNOSIS — R008 Other abnormalities of heart beat: Secondary | ICD-10-CM | POA: Diagnosis not present

## 2015-01-25 DIAGNOSIS — R451 Restlessness and agitation: Secondary | ICD-10-CM | POA: Diagnosis not present

## 2015-01-25 DIAGNOSIS — G309 Alzheimer's disease, unspecified: Secondary | ICD-10-CM | POA: Diagnosis present

## 2015-01-25 DIAGNOSIS — K5909 Other constipation: Secondary | ICD-10-CM | POA: Diagnosis present

## 2015-01-25 DIAGNOSIS — I498 Other specified cardiac arrhythmias: Secondary | ICD-10-CM | POA: Diagnosis not present

## 2015-01-25 DIAGNOSIS — Z9049 Acquired absence of other specified parts of digestive tract: Secondary | ICD-10-CM

## 2015-01-25 DIAGNOSIS — F0280 Dementia in other diseases classified elsewhere without behavioral disturbance: Secondary | ICD-10-CM | POA: Diagnosis present

## 2015-01-25 DIAGNOSIS — R4182 Altered mental status, unspecified: Secondary | ICD-10-CM

## 2015-01-25 DIAGNOSIS — F05 Delirium due to known physiological condition: Secondary | ICD-10-CM | POA: Diagnosis not present

## 2015-01-25 DIAGNOSIS — M48062 Spinal stenosis, lumbar region with neurogenic claudication: Secondary | ICD-10-CM | POA: Diagnosis present

## 2015-01-25 DIAGNOSIS — Z419 Encounter for procedure for purposes other than remedying health state, unspecified: Secondary | ICD-10-CM

## 2015-01-25 DIAGNOSIS — M4806 Spinal stenosis, lumbar region: Principal | ICD-10-CM | POA: Diagnosis present

## 2015-01-25 DIAGNOSIS — M79605 Pain in left leg: Secondary | ICD-10-CM | POA: Diagnosis present

## 2015-01-25 HISTORY — PX: LUMBAR LAMINECTOMY WITH COFLEX 2 LEVEL: SHX6515

## 2015-01-25 SURGERY — LUMBAR LAMINECTOMY WITH COFLEX 2 LEVEL
Anesthesia: General | Site: Spine Lumbar

## 2015-01-25 MED ORDER — SODIUM CHLORIDE 0.9 % IV SOLN: 250.0000 mL | INTRAVENOUS | Status: DC

## 2015-01-25 MED ORDER — ACETAMINOPHEN 10 MG/ML IV SOLN
INTRAVENOUS | Status: AC
  Administered 2015-01-25: 1000 mg via INTRAVENOUS
  Filled 2015-01-25: qty 100

## 2015-01-25 MED ORDER — ACETAMINOPHEN 325 MG PO TABS: 650.0000 mg | ORAL_TABLET | ORAL | Status: DC | PRN

## 2015-01-25 MED ORDER — DEXMEDETOMIDINE HCL IN NACL 200 MCG/50ML IV SOLN
INTRAVENOUS | Status: AC
  Filled 2015-01-25: qty 50

## 2015-01-25 MED ORDER — CEFAZOLIN SODIUM 1-5 GM-% IV SOLN
1.0000 g | Freq: Three times a day (TID) | INTRAVENOUS | Status: AC
  Administered 2015-01-25 – 2015-01-26 (×2): 1 g via INTRAVENOUS
  Filled 2015-01-25 (×2): qty 50

## 2015-01-25 MED ORDER — OXYCODONE HCL 5 MG/5ML PO SOLN: 5.0000 mg | Freq: Once | ORAL | Status: DC | PRN

## 2015-01-25 MED ORDER — BUPIVACAINE HCL (PF) 0.5 % IJ SOLN
INTRAMUSCULAR | Status: DC | PRN
  Administered 2015-01-25: 20 mL
  Administered 2015-01-25: 5 mL

## 2015-01-25 MED ORDER — HYDROMORPHONE HCL 1 MG/ML IJ SOLN
0.2500 mg | INTRAMUSCULAR | Status: DC | PRN
  Administered 2015-01-25 (×2): 0.5 mg via INTRAVENOUS

## 2015-01-25 MED ORDER — METHOCARBAMOL 500 MG PO TABS
500.0000 mg | ORAL_TABLET | Freq: Four times a day (QID) | ORAL | Status: DC | PRN
  Administered 2015-01-29: 500 mg via ORAL
  Filled 2015-01-25 (×3): qty 1

## 2015-01-25 MED ORDER — LIDOCAINE HCL (CARDIAC) 20 MG/ML IV SOLN
INTRAVENOUS | Status: DC | PRN
  Administered 2015-01-25: 50 mg via INTRAVENOUS

## 2015-01-25 MED ORDER — SODIUM CHLORIDE 0.9 % IV SOLN
INTRAVENOUS | Status: DC
  Administered 2015-01-25 – 2015-01-26 (×2): via INTRAVENOUS

## 2015-01-25 MED ORDER — FENTANYL CITRATE (PF) 100 MCG/2ML IJ SOLN
INTRAMUSCULAR | Status: AC
  Filled 2015-01-25: qty 2

## 2015-01-25 MED ORDER — ONDANSETRON HCL 4 MG/2ML IJ SOLN: 4.0000 mg | INTRAMUSCULAR | Status: DC | PRN

## 2015-01-25 MED ORDER — THROMBIN 5000 UNITS EX SOLR
CUTANEOUS | Status: DC | PRN
  Administered 2015-01-25 (×2): 5000 [IU] via TOPICAL

## 2015-01-25 MED ORDER — DEXAMETHASONE SODIUM PHOSPHATE 10 MG/ML IJ SOLN
INTRAMUSCULAR | Status: DC | PRN
  Administered 2015-01-25: 10 mg via INTRAVENOUS

## 2015-01-25 MED ORDER — ROCURONIUM BROMIDE 100 MG/10ML IV SOLN
INTRAVENOUS | Status: DC | PRN
  Administered 2015-01-25: 35 mg via INTRAVENOUS

## 2015-01-25 MED ORDER — FENTANYL CITRATE (PF) 250 MCG/5ML IJ SOLN
INTRAMUSCULAR | Status: AC
  Filled 2015-01-25: qty 5

## 2015-01-25 MED ORDER — HALOPERIDOL LACTATE 5 MG/ML IJ SOLN
2.0000 mg | Freq: Once | INTRAMUSCULAR | Status: DC
  Filled 2015-01-25: qty 0.4

## 2015-01-25 MED ORDER — FENTANYL CITRATE (PF) 100 MCG/2ML IJ SOLN
25.0000 ug | Freq: Once | INTRAMUSCULAR | Status: AC
  Administered 2015-01-25: 25 ug via INTRAVENOUS

## 2015-01-25 MED ORDER — PHENOL 1.4 % MT LIQD: 1.0000 | OROMUCOSAL | Status: DC | PRN

## 2015-01-25 MED ORDER — MENTHOL 3 MG MT LOZG: 1.0000 | LOZENGE | OROMUCOSAL | Status: DC | PRN

## 2015-01-25 MED ORDER — ONDANSETRON HCL 4 MG/2ML IJ SOLN
INTRAMUSCULAR | Status: AC
  Filled 2015-01-25: qty 2

## 2015-01-25 MED ORDER — LIDOCAINE HCL (CARDIAC) 20 MG/ML IV SOLN
INTRAVENOUS | Status: AC
  Filled 2015-01-25: qty 5

## 2015-01-25 MED ORDER — METHOCARBAMOL 1000 MG/10ML IJ SOLN
500.0000 mg | Freq: Four times a day (QID) | INTRAVENOUS | Status: DC | PRN
  Filled 2015-01-25: qty 5

## 2015-01-25 MED ORDER — ALUM & MAG HYDROXIDE-SIMETH 200-200-20 MG/5ML PO SUSP: 30.0000 mL | Freq: Four times a day (QID) | ORAL | Status: DC | PRN

## 2015-01-25 MED ORDER — PROPOFOL 10 MG/ML IV BOLUS
INTRAVENOUS | Status: DC | PRN
  Administered 2015-01-25: 150 mg via INTRAVENOUS

## 2015-01-25 MED ORDER — SODIUM CHLORIDE 0.9 % IR SOLN
Status: DC | PRN
  Administered 2015-01-25: 17:00:00

## 2015-01-25 MED ORDER — GLYCOPYRROLATE 0.2 MG/ML IJ SOLN
INTRAMUSCULAR | Status: DC | PRN
  Administered 2015-01-25: 0.2 mg via INTRAVENOUS
  Administered 2015-01-25: .6 mg via INTRAVENOUS

## 2015-01-25 MED ORDER — AMLODIPINE BESYLATE 10 MG PO TABS
10.0000 mg | ORAL_TABLET | Freq: Every day | ORAL | Status: DC
  Administered 2015-01-26 – 2015-01-30 (×5): 10 mg via ORAL
  Filled 2015-01-25 (×5): qty 1

## 2015-01-25 MED ORDER — ONDANSETRON HCL 4 MG/2ML IJ SOLN
4.0000 mg | Freq: Four times a day (QID) | INTRAMUSCULAR | Status: DC | PRN
Start: 2015-01-25 — End: 2015-01-25

## 2015-01-25 MED ORDER — HYDROMORPHONE HCL 1 MG/ML IJ SOLN
0.5000 mg | INTRAMUSCULAR | Status: DC | PRN
  Administered 2015-01-27 – 2015-01-28 (×4): 1 mg via INTRAVENOUS
  Filled 2015-01-25 (×4): qty 1

## 2015-01-25 MED ORDER — KETOROLAC TROMETHAMINE 15 MG/ML IJ SOLN
15.0000 mg | Freq: Four times a day (QID) | INTRAMUSCULAR | Status: AC
Start: 2015-01-25 — End: 2015-01-26
  Administered 2015-01-25 – 2015-01-26 (×5): 15 mg via INTRAVENOUS
  Filled 2015-01-25 (×6): qty 1

## 2015-01-25 MED ORDER — ONDANSETRON HCL 4 MG/2ML IJ SOLN
INTRAMUSCULAR | Status: DC | PRN
  Administered 2015-01-25: 4 mg via INTRAVENOUS

## 2015-01-25 MED ORDER — FENTANYL CITRATE (PF) 100 MCG/2ML IJ SOLN: 25.0000 ug | INTRAMUSCULAR | Status: DC | PRN

## 2015-01-25 MED ORDER — HALOPERIDOL LACTATE 5 MG/ML IJ SOLN
3.0000 mg | Freq: Once | INTRAMUSCULAR | Status: AC
  Administered 2015-01-25: 3 mg via INTRAVENOUS
  Filled 2015-01-25: qty 0.6

## 2015-01-25 MED ORDER — PROPOFOL 10 MG/ML IV BOLUS
INTRAVENOUS | Status: AC
  Filled 2015-01-25: qty 20

## 2015-01-25 MED ORDER — HYDROCODONE-ACETAMINOPHEN 5-325 MG PO TABS: 1.0000 | ORAL_TABLET | ORAL | Status: DC | PRN

## 2015-01-25 MED ORDER — HEMOSTATIC AGENTS (NO CHARGE) OPTIME
TOPICAL | Status: DC | PRN
  Administered 2015-01-25: 1 via TOPICAL

## 2015-01-25 MED ORDER — EPHEDRINE SULFATE 50 MG/ML IJ SOLN
INTRAMUSCULAR | Status: DC | PRN
  Administered 2015-01-25: 10 mg via INTRAVENOUS
  Administered 2015-01-25: 5 mg via INTRAVENOUS

## 2015-01-25 MED ORDER — ROCURONIUM BROMIDE 50 MG/5ML IV SOLN
INTRAVENOUS | Status: AC
  Filled 2015-01-25: qty 1

## 2015-01-25 MED ORDER — THROMBIN 5000 UNITS EX SOLR
CUTANEOUS | Status: DC | PRN
  Administered 2015-01-25: 18:00:00 via TOPICAL

## 2015-01-25 MED ORDER — FENTANYL CITRATE (PF) 100 MCG/2ML IJ SOLN
INTRAMUSCULAR | Status: DC | PRN
  Administered 2015-01-25 (×4): 50 ug via INTRAVENOUS
  Administered 2015-01-25: 100 ug via INTRAVENOUS

## 2015-01-25 MED ORDER — 0.9 % SODIUM CHLORIDE (POUR BTL) OPTIME
TOPICAL | Status: DC | PRN
  Administered 2015-01-25: 1000 mL

## 2015-01-25 MED ORDER — SUCCINYLCHOLINE CHLORIDE 20 MG/ML IJ SOLN
INTRAMUSCULAR | Status: DC | PRN
  Administered 2015-01-25: 120 mg via INTRAVENOUS

## 2015-01-25 MED ORDER — OXYCODONE HCL 5 MG PO TABS: 5.0000 mg | ORAL_TABLET | Freq: Once | ORAL | Status: DC | PRN

## 2015-01-25 MED ORDER — PHENYLEPHRINE HCL 10 MG/ML IJ SOLN
10.0000 mg | INTRAVENOUS | Status: DC | PRN
  Administered 2015-01-25: 20 ug/min via INTRAVENOUS

## 2015-01-25 MED ORDER — ACETAMINOPHEN 650 MG RE SUPP: 650.0000 mg | RECTAL | Status: DC | PRN

## 2015-01-25 MED ORDER — LIDOCAINE-EPINEPHRINE 1 %-1:100000 IJ SOLN
INTRAMUSCULAR | Status: DC | PRN
  Administered 2015-01-25: 5 mL

## 2015-01-25 MED ORDER — SODIUM CHLORIDE 0.9 % IJ SOLN: 3.0000 mL | INTRAMUSCULAR | Status: DC | PRN

## 2015-01-25 MED ORDER — SODIUM CHLORIDE 0.9 % IJ SOLN
3.0000 mL | Freq: Two times a day (BID) | INTRAMUSCULAR | Status: DC
  Administered 2015-01-25 – 2015-01-29 (×8): 3 mL via INTRAVENOUS

## 2015-01-25 MED ORDER — OXYCODONE-ACETAMINOPHEN 5-325 MG PO TABS
1.0000 | ORAL_TABLET | ORAL | Status: DC | PRN
  Administered 2015-01-26 – 2015-01-28 (×5): 2 via ORAL
  Administered 2015-01-29: 1 via ORAL
  Administered 2015-01-29: 2 via ORAL
  Filled 2015-01-25 (×3): qty 2
  Filled 2015-01-25: qty 1
  Filled 2015-01-25 (×3): qty 2

## 2015-01-25 MED ORDER — GLYCOPYRROLATE 0.2 MG/ML IJ SOLN
INTRAMUSCULAR | Status: AC
  Filled 2015-01-25: qty 3

## 2015-01-25 MED ORDER — NEOSTIGMINE METHYLSULFATE 10 MG/10ML IV SOLN
INTRAVENOUS | Status: DC | PRN
  Administered 2015-01-25: 4 mg via INTRAVENOUS

## 2015-01-25 MED ORDER — LACTATED RINGERS IV SOLN
INTRAVENOUS | Status: DC
  Administered 2015-01-25 (×2): via INTRAVENOUS

## 2015-01-25 MED ORDER — HYDROMORPHONE HCL 1 MG/ML IJ SOLN
INTRAMUSCULAR | Status: AC
  Filled 2015-01-25: qty 1

## 2015-01-25 SURGICAL SUPPLY — 56 items
ADH SKN CLS APL DERMABOND .7 (GAUZE/BANDAGES/DRESSINGS) ×1
BAG DECANTER FOR FLEXI CONT (MISCELLANEOUS) ×3 IMPLANT
BLADE CLIPPER SURG (BLADE) IMPLANT
BUR ACORN 6.0 (BURR) IMPLANT
BUR ACORN 6.0MM (BURR)
BUR MATCHSTICK NEURO 3.0 LAGG (BURR) ×3 IMPLANT
CANISTER SUCT 3000ML PPV (MISCELLANEOUS) ×3 IMPLANT
DECANTER SPIKE VIAL GLASS SM (MISCELLANEOUS) ×3 IMPLANT
DERMABOND ADVANCED (GAUZE/BANDAGES/DRESSINGS) ×2
DERMABOND ADVANCED .7 DNX12 (GAUZE/BANDAGES/DRESSINGS) ×1 IMPLANT
DEVICE COFLEX STABLIZATION 10M (Neuro Prosthesis/Implant) ×2 IMPLANT
DRAPE C-ARM 42X72 X-RAY (DRAPES) ×6 IMPLANT
DRAPE C-ARMOR (DRAPES) ×3 IMPLANT
DRAPE LAPAROTOMY T 102X78X121 (DRAPES) ×3 IMPLANT
DRAPE MICROSCOPE LEICA (MISCELLANEOUS) IMPLANT
DRAPE POUCH INSTRU U-SHP 10X18 (DRAPES) ×3 IMPLANT
DRAPE PROXIMA HALF (DRAPES) IMPLANT
DRSG OPSITE POSTOP 4X8 (GAUZE/BANDAGES/DRESSINGS) ×2 IMPLANT
DURAPREP 26ML APPLICATOR (WOUND CARE) ×3 IMPLANT
ELECT BLADE 4.0 EZ CLEAN MEGAD (MISCELLANEOUS) ×3
ELECT REM PT RETURN 9FT ADLT (ELECTROSURGICAL) ×3
ELECTRODE BLDE 4.0 EZ CLN MEGD (MISCELLANEOUS) IMPLANT
ELECTRODE REM PT RTRN 9FT ADLT (ELECTROSURGICAL) ×1 IMPLANT
GAUZE SPONGE 4X4 12PLY STRL (GAUZE/BANDAGES/DRESSINGS) ×3 IMPLANT
GAUZE SPONGE 4X4 16PLY XRAY LF (GAUZE/BANDAGES/DRESSINGS) IMPLANT
GLOVE BIOGEL PI IND STRL 8.5 (GLOVE) ×1 IMPLANT
GLOVE BIOGEL PI INDICATOR 8.5 (GLOVE) ×2
GLOVE ECLIPSE 8.5 STRL (GLOVE) ×3 IMPLANT
GLOVE EXAM NITRILE LRG STRL (GLOVE) IMPLANT
GLOVE EXAM NITRILE MD LF STRL (GLOVE) IMPLANT
GLOVE EXAM NITRILE XL STR (GLOVE) IMPLANT
GLOVE EXAM NITRILE XS STR PU (GLOVE) IMPLANT
GOWN STRL REUS W/ TWL LRG LVL3 (GOWN DISPOSABLE) IMPLANT
GOWN STRL REUS W/ TWL XL LVL3 (GOWN DISPOSABLE) IMPLANT
GOWN STRL REUS W/TWL 2XL LVL3 (GOWN DISPOSABLE) ×3 IMPLANT
GOWN STRL REUS W/TWL LRG LVL3 (GOWN DISPOSABLE)
GOWN STRL REUS W/TWL XL LVL3 (GOWN DISPOSABLE)
HEMOSTAT POWDER KIT SURGIFOAM (HEMOSTASIS) ×2 IMPLANT
KIT BASIN OR (CUSTOM PROCEDURE TRAY) ×3 IMPLANT
KIT ROOM TURNOVER OR (KITS) ×3 IMPLANT
NDL SPNL 20GX3.5 QUINCKE YW (NEEDLE) IMPLANT
NEEDLE HYPO 22GX1.5 SAFETY (NEEDLE) ×3 IMPLANT
NEEDLE SPNL 20GX3.5 QUINCKE YW (NEEDLE) IMPLANT
NS IRRIG 1000ML POUR BTL (IV SOLUTION) ×3 IMPLANT
PACK LAMINECTOMY NEURO (CUSTOM PROCEDURE TRAY) ×3 IMPLANT
PAD ARMBOARD 7.5X6 YLW CONV (MISCELLANEOUS) ×9 IMPLANT
PATTIES SURGICAL .5 X1 (DISPOSABLE) ×3 IMPLANT
RUBBERBAND STERILE (MISCELLANEOUS) IMPLANT
SPONGE SURGIFOAM ABS GEL SZ50 (HEMOSTASIS) ×3 IMPLANT
SUT VIC AB 1 CT1 18XBRD ANBCTR (SUTURE) ×1 IMPLANT
SUT VIC AB 1 CT1 8-18 (SUTURE) ×3
SUT VIC AB 2-0 CP2 18 (SUTURE) ×5 IMPLANT
SUT VIC AB 3-0 SH 8-18 (SUTURE) ×5 IMPLANT
TOWEL OR 17X24 6PK STRL BLUE (TOWEL DISPOSABLE) ×3 IMPLANT
TOWEL OR 17X26 10 PK STRL BLUE (TOWEL DISPOSABLE) ×3 IMPLANT
WATER STERILE IRR 1000ML POUR (IV SOLUTION) ×3 IMPLANT

## 2015-01-25 NOTE — Anesthesia Postprocedure Evaluation (Signed)
Anesthesia Post Note  Patient: Anthony Tyler  Procedure(s) Performed: Procedure(s) (LRB): Lumbar Two-Three, Lumbar Three-Four Laminectomy with coflex (N/A)  Patient location during evaluation: PACU Anesthesia Type: General Level of consciousness: patient uncooperative, combative and confused Pain management: pain level controlled Vital Signs Assessment: post-procedure vital signs reviewed and stable Respiratory status: spontaneous breathing, nonlabored ventilation, respiratory function stable and patient connected to nasal cannula oxygen Cardiovascular status: blood pressure returned to baseline and stable Postop Assessment: no signs of nausea or vomiting Anesthetic complications: no  Patient was initially disoriented, moving all extremities, but with likely acute emergence delirium on top of chronic underlying dementia with patients known documentation of alzheimer's. He did pull out his own IV and so restraints were needed in addition to constant redirection and a sitter. We affirmed that his respirations were adequate, then we next attempted redirection and ensured his pain was adequately treated with pain medication. We did place a foley after his bladder scan revealed >665ml with no documented urine during the case or after. After re-establishing IV access we delivered 3mg  IV haldol and 84mcg precedex total over 20 minutes titrated to sedation affect so that he was no longer attempting to remove his IV. His response was as desired and he was observed in pacu until stable for step down discharge. Dr. Ellene Route was in agreement and updated with the post op course. Last Vitals:  Filed Vitals:   01/25/15 1055 01/25/15 1829  BP:    Pulse: 83   Temp:  36.3 C  Resp:      Last Pain: There were no vitals filed for this visit.               [name]

## 2015-01-25 NOTE — OR Nursing (Signed)
Patient unable to follow commands, persistently trying to get out of bed. Dr. Jillyn Hidden, and Dr. Ellene Route aware. Daughter in PACU to try to calm patient. Patient continued to be agitated, restless, unable to follow commands. Dr. Jillyn Hidden at bedside, IV sedation administered. Dr. Ellene Route updated on PACU events. Patient to transfer to ICU from PACU.  Family update provided.

## 2015-01-25 NOTE — Anesthesia Preprocedure Evaluation (Signed)
Anesthesia Evaluation  Patient identified by MRN, date of birth, ID band Patient awake    Reviewed: Allergy & Precautions, NPO status , Patient's Chart, lab work & pertinent test results  Airway Mallampati: II   Neck ROM: full    Dental   Pulmonary neg pulmonary ROS,    breath sounds clear to auscultation       Cardiovascular hypertension,  Rhythm:regular Rate:Normal     Neuro/Psych Alzheimer's dz.    GI/Hepatic PUD,   Endo/Other    Renal/GU      Musculoskeletal   Abdominal   Peds  Hematology   Anesthesia Other Findings   Reproductive/Obstetrics                             Anesthesia Physical Anesthesia Plan  ASA: III  Anesthesia Plan: General   Post-op Pain Management:    Induction: Intravenous  Airway Management Planned: Oral ETT  Additional Equipment:   Intra-op Plan:   Post-operative Plan: Extubation in OR  Informed Consent: I have reviewed the patients History and Physical, chart, labs and discussed the procedure including the risks, benefits and alternatives for the proposed anesthesia with the patient or authorized representative who has indicated his/her understanding and acceptance.     Plan Discussed with: CRNA, Anesthesiologist and Surgeon  Anesthesia Plan Comments:         Anesthesia Quick Evaluation

## 2015-01-25 NOTE — Anesthesia Procedure Notes (Signed)
Procedure Name: Intubation Date/Time: 01/25/2015 3:56 PM Performed by: Rebekah Chesterfield L Pre-anesthesia Checklist: Timeout performed, Patient identified, Emergency Drugs available, Suction available and Patient being monitored Patient Re-evaluated:Patient Re-evaluated prior to inductionOxygen Delivery Method: Circle system utilized Preoxygenation: Pre-oxygenation with 100% oxygen Intubation Type: IV induction and Rapid sequence Ventilation: Mask ventilation with difficulty Laryngoscope Size: Mac and 4 Grade View: Grade I Tube type: Oral Tube size: 8.0 mm Number of attempts: 1 Airway Equipment and Method: Stylet Placement Confirmation: ETT inserted through vocal cords under direct vision,  positive ETCO2 and breath sounds checked- equal and bilateral Secured at: 22 cm Tube secured with: Tape Dental Injury: Teeth and Oropharynx as per pre-operative assessment  Comments: Unable to achieve mask seal due to nasal size, grade 1 DL s probs

## 2015-01-25 NOTE — Progress Notes (Signed)
Patient ID: Anthony Tyler, male   DOB: [birth year], 81 y.o.   MRN: [id number] Vital signs are stable Patient is agitated postoperatively Moving all 4 extremities well

## 2015-01-25 NOTE — Transfer of Care (Signed)
Immediate Anesthesia Transfer of Care Note  Patient: [name]  Procedure(s) Performed: Procedure(s) with comments: Lumbar Two-Three, Lumbar Three-Four Laminectomy with coflex (N/A) - L2-3 L3-4 Laminectomy with coflex  Patient Location: PACU  Anesthesia Type:General  Level of Consciousness: awake, alert , oriented and patient cooperative  Airway & Oxygen Therapy: Patient Spontanous Breathing and Patient connected to nasal cannula oxygen  Post-op Assessment: Report given to RN and Post -op Vital signs reviewed and stable  Post vital signs: Reviewed and stable  Last Vitals:  Filed Vitals:   01/25/15 1054 01/25/15 1055  BP: 141/83   Pulse:  83  Temp: 37.1 C   Resp: 18     Complications: No apparent anesthesia complications

## 2015-01-25 NOTE — H&P (Signed)
Anthony Tyler is an 81 y.o. male.   Chief Complaint: Pain and weakness in legs with inability to walk but short distances HPI: Mr. Anthony Tyler is an 81 year old individual who's had spondylosis of the lower lumbar spine for a number of years. I have treated him conservatively with intermittent epidural sterile injections and he initially was responding well for a few years. In the last year he developed increasing pain with even short distance walking and his back pain has remained fairly consistent. Recent MRI demonstrates that the patient now has severe spondylitic stenosis at L2-3 and L3-4. Is advised regarding the need for surgical decompression. The patient also have placement of Coflex at L2-3 and L3-4.  Past Medical History  Diagnosis Date  . Glaucoma     BOTH EYES  . Partial blindness     RIGHT EYE  . Enlargement of the nose     OUTER NOSE VERY ENLARGED--STATES HE SOMETIMES MASHES BUMPS ON THE NOSE AND BLOOD WILL COME OUT OF BUMPS.  PT DOES NOT HAVE PCP--SAW A DERMATOLOGIST ONCE--BUT STOPPED THE "PILLS" HE WAS GIVEN-DIDN'T LIKE THE WAY THE MED MADE HIM FEEL  . Hypertension     takes Amlodipine daily  . Weakness     numbness and tingling  . Edema     in feet   . Alzheimer disease     1st stage  . Chronic back pain     stenosis  . Inguinal hernia     left  . Urinary frequency   . Urinary urgency   . Hemorrhoids   . Dry eyes   . Warts     multiple on right ear lobe    Past Surgical History  Procedure Laterality Date  . Cataract extraction Bilateral 03/2011  . Eye surgery      RETINAL SURGERY RT EYE FOR EYE HEMORRHAGE--THEN HAD CATARACT REMOVED RT EYE  . Tonsillectomy      AGE 51  . Hemicolectomy    . Esophagogastroduodenoscopy N/A 08/04/2013    Procedure: ESOPHAGOGASTRODUODENOSCOPY (EGD);  Surgeon: Wonda Horner, MD;  Location: [facility];  Service: Endoscopy;  Laterality: N/A;  . Hernia repair Right 2008  . Colonoscopy      Family History  Problem  Relation Age of Onset  . Stroke Mother    Social History:  reports that he has never smoked. He has never used smokeless tobacco. He reports that he does not drink alcohol or use illicit drugs.  Allergies: No Known Allergies  Medications Prior to Admission  Medication Sig Dispense Refill  . acetaminophen (TYLENOL) 500 MG tablet Take 500 mg by mouth every 6 (six) hours as needed for pain. PAIN    . amLODipine (NORVASC) 10 MG tablet Take 10 mg by mouth daily.    . Menthol-Methyl Salicylate (MUSCLE RUB) 10-15 % CREA Apply 1 application topically as needed for muscle pain.      No results found for this or any previous visit (from the past 48 hour(s)). No results found.  Review of Systems  Constitutional: Negative.   HENT: Negative.   Eyes: Negative.   Respiratory: Negative.   Cardiovascular: Negative.   Genitourinary: Negative.   Skin: Negative.   Neurological:       Weakness and pain in both legs when walking even short distances less than 30 feet  Endo/Heme/Allergies: Negative.   Psychiatric/Behavioral: Negative.     Blood pressure 141/83, pulse 83, temperature 98.7 F (37.1 C), temperature source Oral, resp. rate 18, height  6\' 2"  (1.88 m), weight 81.647 kg (180 lb), SpO2 97 %. Physical Exam  Constitutional: He is oriented to person, place, and time. He appears well-developed and well-nourished.  HENT:  Head: Normocephalic and atraumatic.  Eyes: Conjunctivae and EOM are normal. Pupils are equal, round, and reactive to light.  Neck: Normal range of motion. Neck supple.  Cardiovascular: Normal rate and regular rhythm.   Respiratory: Effort normal and breath sounds normal.  GI: Soft. Bowel sounds are normal.  Musculoskeletal:  Midline back pain to percussion. Straight leg raising is negative to 60 in either lower extremity. Patrick's maneuvers are negative  Neurological: He is alert and oriented to person, place, and time.  Remedies in the patellae and Achilles. Marked  weakness in the iliopsoas quadricep tibialis anterior and gastrocs with 4 out of 5 strength grade in each group. Patient walks with a very wide-based gait. There is no spasticity. Upper extremity strength appears to be normal. He flexes in the upper extremities are absent also the biceps and triceps. Inial nerve examination is normal  Skin: Skin is warm and dry.  Psychiatric: He has a normal mood and affect. His behavior is normal. Judgment and thought content normal.     Assessment/Plan Lumbar stenosis L2-3 L3-4 with neurogenic claudication, lumbar radiculopathy.  Bilateral laminotomies and foraminotomies L2-3 L3-4. Placement of Coflex L2-3 L3-4.  Vennela Jutte J 01/25/2015, 3:09 PM

## 2015-01-25 NOTE — Op Note (Signed)
Date of surgery: 01/25/2015 Preoperative diagnosis: Lumbar spinal stenosis L2-3 L3-4 with neurogenic claudication, lumbar radiculopathy Postoperative diagnosis: Lumbar spinal stenosis L2-3, L3-4 with neurogenic claudication, lumbar radiculopathy Procedure: Bilateral laminotomies and decompression L2-3 and L3-4 placement of Coflex L3-4 Surgeon: Kristeen Miss First assistant: Deri Fuelling M.D. Anesthesia: Gen. endotracheal Indications: Anthony Tyler is an 81 year old individual who has had significant worsening spinal stenosis with neurogenic claudication and lumbar radiculopathy he is been failing considerably in the last months. Is advised regarding surgical decompression at L2-3 and L3-4 and placement of Coflex device.  Procedure: The patient was brought to the operating room supine on a stretcher. After the smooth induction of general endotracheal anesthesia, he was turned prone. The back was prepped and with alcohol and DuraPrep and draped in a sterile fashion. A midline incision was created in the lower lumbar spine. Dissection was carried down to expose the first 2 spinous processes which were identified each as L3 and L4. Then the dissection was carried cephalad to expose L2. The L2-3 and the L3-4 spaces were then each dissected and laminotomies were then created sequentially at L2-3 and L3-4 on the left and L2-3 and L3-4 on the right. The laminotomies were enlarged and redundant yellow ligament was taken from out in the lateral recesses using a curved Kerrison punch in addition to upgoing curettes which enlarged the laminectomy for passage of the central canal. Once laminotomies were completed the integrity of the interspinous ligaments were tested at L2-L3 little motion could be instilled by grasping each spinous process and distracting the ligament appeared sound at L3-L4 there appeared to be gross motion of the ligament and it was felt that this would be a good candidate for placement of the  Coflex. After clearing the interspinous ligament the interspace was distracted to the 10 mm size. A 10 mm size Coflex was then placed into the interspinous space which allowed for distraction of the facet joints. Once the Coflex was clamped to the spinous processes final localizing radiographs identified good position of the device placement. Hemostasis was checked in the wound and then the lumbar dorsal fascia was closed with #1 Vicryls 20 Vicryls used in the subcutaneous tissues and 30 Vicryls subcuticularly. Blood loss for the procedure was approximately 100 mL. Patient was returned to recovery room stable.

## 2015-01-26 ENCOUNTER — Encounter (HOSPITAL_COMMUNITY): Payer: Self-pay | Admitting: Neurological Surgery

## 2015-01-26 DIAGNOSIS — K5900 Constipation, unspecified: Secondary | ICD-10-CM

## 2015-01-26 DIAGNOSIS — I493 Ventricular premature depolarization: Secondary | ICD-10-CM | POA: Diagnosis present

## 2015-01-26 DIAGNOSIS — I10 Essential (primary) hypertension: Secondary | ICD-10-CM

## 2015-01-26 DIAGNOSIS — I498 Other specified cardiac arrhythmias: Secondary | ICD-10-CM

## 2015-01-26 LAB — BASIC METABOLIC PANEL
ANION GAP: 8 (ref 5–15)
BUN: 19 mg/dL (ref 6–20)
CALCIUM: 8.4 mg/dL — AB (ref 8.9–10.3)
CHLORIDE: 109 mmol/L (ref 101–111)
CO2: 21 mmol/L — AB (ref 22–32)
Creatinine, Ser: 1.24 mg/dL (ref 0.61–1.24)
GFR calc non Af Amer: 52 mL/min — ABNORMAL LOW (ref >60)
Glucose, Bld: 141 mg/dL — ABNORMAL HIGH (ref 65–99)
POTASSIUM: 4.8 mmol/L (ref 3.5–5.1)
SODIUM: 138 mmol/L (ref 135–145)

## 2015-01-26 LAB — TROPONIN I

## 2015-01-26 LAB — MAGNESIUM: Magnesium: 1.7 mg/dL (ref 1.7–2.4)

## 2015-01-26 MED ORDER — POLYETHYLENE GLYCOL 3350 17 G PO PACK
17.0000 g | PACK | Freq: Every day | ORAL | Status: DC
  Administered 2015-01-26 – 2015-01-30 (×4): 17 g via ORAL
  Filled 2015-01-26 (×5): qty 1

## 2015-01-26 MED ORDER — SENNOSIDES-DOCUSATE SODIUM 8.6-50 MG PO TABS
1.0000 | ORAL_TABLET | Freq: Two times a day (BID) | ORAL | Status: DC
  Administered 2015-01-26 – 2015-01-30 (×7): 1 via ORAL
  Filled 2015-01-26 (×8): qty 1

## 2015-01-26 MED ORDER — MAGNESIUM SULFATE 2 GM/50ML IV SOLN
2.0000 g | Freq: Once | INTRAVENOUS | Status: AC
  Administered 2015-01-26: 2 g via INTRAVENOUS
  Filled 2015-01-26: qty 50

## 2015-01-26 MED ORDER — BISACODYL 10 MG RE SUPP
10.0000 mg | Freq: Every day | RECTAL | Status: DC | PRN
  Administered 2015-01-30: 10 mg via RECTAL
  Filled 2015-01-26: qty 1

## 2015-01-26 NOTE — Progress Notes (Signed)
Pt still having PVCs, in and out of bigeminy, quadrigeminy after Magnesium replacement. Neurosurgery notified and plan for cardiology consult.

## 2015-01-26 NOTE — Evaluation (Signed)
Physical Therapy Evaluation Patient Details Name: Anthony Tyler MRN: [id number] DOB: February 19, 1931 Today's Date: 01/26/2015   History of Present Illness  pt presents with L2-4 Lami.  pt with hx of Alzheimer's, Visual Deficits in R Eye, Enlargement of the Nose, and HTN.    Clinical Impression  Pt with Alzheimer's at baseline, but cognition worse post-op and needs increased A for following directions and staying on task.  Per son pt will need to be ambulatory in order to return home with wife, so feel pt would benefit from SNF level of care to maximize independence and decrease burden of care prior to returning to home.  Will continue to follow.      Follow Up Recommendations SNF    Equipment Recommendations  None recommended by PT    Recommendations for Other Services       Precautions / Restrictions Precautions Precautions: Fall;Back Precaution Booklet Issued: No Precaution Comments: Discussed back precautions with pt and son present.   Required Braces or Orthoses: Spinal Brace Spinal Brace: Lumbar corset;Applied in sitting position Restrictions Weight Bearing Restrictions: No      Mobility  Bed Mobility Overal bed mobility: Needs Assistance Bed Mobility: Sit to Sidelying         Sit to sidelying: Mod assist General bed mobility comments: pt needs multiple cues to follow PT's direction for returning to bed.  pt attempted to scoot to EOB then scooted laterally all in attempts to follow directions.    Transfers Overall transfer level: Needs assistance Equipment used: 2 person hand held assist Transfers: Sit to/from [facility] Sit to Stand: Mod assist;+2 physical assistance Stand pivot transfers: Mod assist;+2 physical assistance       General transfer comment: cues for UE use and movement through pivot.  pt needs cues to stay on task.    Ambulation/Gait                Stairs            Wheelchair Mobility    Modified Rankin  (Stroke Patients Only)       Balance Overall balance assessment: Needs assistance Sitting-balance support: Single extremity supported;Feet supported Sitting balance-Leahy Scale: Fair     Standing balance support: Bilateral upper extremity supported;During functional activity Standing balance-Leahy Scale: Poor                               Pertinent Vitals/Pain Pain Assessment: No/denies pain    Home Living Family/patient expects to be discharged to:: Private residence Living Arrangements: Spouse/significant other Available Help at Discharge: Family;Available 24 hours/day Type of Home: House Home Access: Level entry     Home Layout: One level (Son notes single step down to an extra room.) Home Equipment: Kasandra Knudsen - single point Additional Comments: Per son pt and wife are home alone and that he is unsure how much family can A them.      Prior Function Level of Independence: Independent with assistive device(s) (Occasionally used a cane when not feeling well.  )               Hand Dominance        Extremity/Trunk Assessment   Upper Extremity Assessment: Defer to OT evaluation           Lower Extremity Assessment: Generalized weakness      Cervical / Trunk Assessment: Normal  Communication   Communication: No difficulties  Cognition Arousal/Alertness: Awake/alert Behavior  During Therapy: WFL for tasks assessed/performed Overall Cognitive Status: Impaired/Different from baseline Area of Impairment: Orientation;Attention;Memory;Following commands;Safety/judgement;Awareness;Problem solving Orientation Level: Disoriented to;Place;Time;Situation Current Attention Level: Sustained Memory: Decreased short-term memory;Decreased recall of precautions Following Commands: Follows one step commands consistently;Follows multi-step commands inconsistently Safety/Judgement: Decreased awareness of safety;Decreased awareness of deficits Awareness:  Emergent Problem Solving: Slow processing;Difficulty sequencing;Requires verbal cues;Requires tactile cues General Comments: pt is normally Independent in his own environment.  Normally oriented to self, location, and situation, but has trouble with the date.  Wife manages meds and homemaking tasks.      General Comments      Exercises        Assessment/Plan    PT Assessment Patient needs continued PT services  PT Diagnosis Difficulty walking;Generalized weakness   PT Problem List Decreased strength;Decreased activity tolerance;Decreased balance;Decreased mobility;Decreased coordination;Decreased cognition;Decreased knowledge of use of DME;Decreased safety awareness;Decreased knowledge of precautions  PT Treatment Interventions DME instruction;Gait training;Stair training;Functional mobility training;Therapeutic activities;Therapeutic exercise;Balance training;Neuromuscular re-education;Cognitive remediation;Patient/family education   PT Goals (Current goals can be found in the Care Plan section) Acute Rehab PT Goals Patient Stated Goal: Per son for pt to walk before returning to home.   PT Goal Formulation: With patient/family Time For Goal Achievement: 02/09/15 Potential to Achieve Goals: Good    Frequency Min 5X/week   Barriers to discharge Decreased caregiver support      Co-evaluation               End of Session Equipment Utilized During Treatment: Gait belt;Back brace Activity Tolerance: Patient tolerated treatment well Patient left: in bed;with call bell/phone within reach;with bed alarm set;with family/visitor present Nurse Communication: Mobility status         Time: [id number] PT Time Calculation (min) (ACUTE ONLY): 20 min   Charges:   PT Evaluation $Initial PT Evaluation Tier I: 1 Procedure     PT G CodesCatarina Hartshorn, [place] 01/26/2015, 11:44 AM

## 2015-01-26 NOTE — Clinical Social Work Placement (Signed)
   CLINICAL SOCIAL WORK PLACEMENT  NOTE  Date:  01/26/2015  Patient Details  Name: TYRIQUE PETRICH MRN: [id number] Date of Birth: 08/26/1930  Clinical Social Work is seeking post-discharge placement for this patient at the [place] level of care (*CSW will initial, date and re-position this form in  chart as items are completed):  Yes   Patient/family provided with [facility] Work Department's list of facilities offering this level of care within the geographic area requested by the patient (or if unable, by the patient's family).  Yes   Patient/family informed of their freedom to choose among providers that offer the needed level of care, that participate in Medicare, Medicaid or managed care program needed by the patient, have an available bed and are willing to accept the patient.  Yes   Patient/family informed of [facility]'s ownership interest in [facility] and [facility], as well as of the fact that they are under no obligation to receive care at these facilities.  PASRR submitted to EDS on 01/26/15     PASRR number received on 01/26/15     Existing PASRR number confirmed on       FL2 transmitted to all facilities in geographic area requested by pt/family on 01/26/15     FL2 transmitted to all facilities within larger geographic area on       Patient informed that his/her managed care company has contracts with or will negotiate with certain facilities, including the following:            Patient/family informed of bed offers received.  Patient chooses bed at       Physician recommends and patient chooses bed at      Patient to be transferred to   on  .  Patient to be transferred to facility by       Patient family notified on   of transfer.  Name of family member notified:        PHYSICIAN Please sign FL2     Additional Comment:    Barbette Or, [place]

## 2015-01-26 NOTE — Progress Notes (Signed)
Patient ID: Anthony Tyler, male   DOB: 03-21-1930, 81 y.o.   MRN: [id number] Vital signs are stable no trigeminy was noted today Motor function is intact patient remains confused and minimally verbal His incision is clean and dry Continue supportive care in ICU.

## 2015-01-26 NOTE — Consult Note (Signed)
PULMONARY / CRITICAL CARE MEDICINE   Name: Anthony Tyler MRN: [id number] DOB: [birth year]    ADMISSION DATE:  01/25/2015 CONSULTATION DATE:  01/26/15  REFERRING MD :  Dr. Ellene Route   CHIEF COMPLAINT:  PVC's   HISTORY OF PRESENT ILLNESS:   81 y/o M with PMH of glaucoma, partial blindness, HTN, alzheimer's disease, chronic back pain, urinary frequency / urgency, chronic low back pain & LE weakness who was admitted per Dr. Ellene Route on 11/28 for planned surgical decompression.    The patient has been followed by Dr. Ellene Route for years and has been treated for low back pain / weakness with intermittent epidural injections.  He initially responded well to the injections but later developed increasing low back pain and difficulty walking even short distances.  He had a repeat MRI that demonstrated severe spondylitic stenosis of L2-L3, L3-L4 and was admitted for planned surgical decompression.  He underwent surgery on 11/28 with placement of Coflex.  EBL for procedure was approximately 138ml.  He was returned to NSICU post-operatively.  The patient's post-operative period was notable for periods of restlessness and agitation.  On 11/29, he was noted to have trigeminy / PVC's and PCCM was consulted for evaluation.    Family reports patient is at baseline (confused but pleasant) and he denies complaints.  Daughter reports that he is chronically constipated and he will occasionally use poppsicle sticks to disimpact himself.  The patient currently denies chest pain or shortness of breath.    PAST MEDICAL HISTORY :  He  has a past medical history of Glaucoma; Partial blindness; Enlargement of the nose; Hypertension; Weakness; Edema; Alzheimer disease; Chronic back pain; Inguinal hernia; Urinary frequency; Urinary urgency; Hemorrhoids; Dry eyes; and Warts.  PAST SURGICAL HISTORY: He  has past surgical history that includes Cataract extraction (Bilateral, 03/2011); Eye surgery; Tonsillectomy; Hemicolectomy;  Esophagogastroduodenoscopy (N/A, 08/04/2013); Hernia repair (Right, 2008); Colonoscopy; and Lumbar laminectomy with coflex 2 level (N/A, 01/25/2015).  No Known Allergies  No current facility-administered medications on file prior to encounter.   Current Outpatient Prescriptions on File Prior to Encounter  Medication Sig  . acetaminophen (TYLENOL) 500 MG tablet Take 500 mg by mouth every 6 (six) hours as needed for pain. PAIN  . Menthol-Methyl Salicylate (MUSCLE RUB) 10-15 % CREA Apply 1 application topically as needed for muscle pain.    FAMILY HISTORY:  His indicated that his mother is deceased. He indicated that his father is deceased.   SOCIAL HISTORY: He  reports that he has never smoked. He has never used smokeless tobacco. He reports that he does not drink alcohol or use illicit drugs.  REVIEW OF SYSTEMS:   Unable to complete with patient as he has baseline dementia.  SUBJECTIVE:   VITAL SIGNS: BP 127/76 mmHg  Pulse 102  Temp(Src) 98.4 F (36.9 C) (Oral)  Resp 16  Ht 6\' 2"  (1.88 m)  Wt 180 lb (81.647 kg)  BMI 23.10 kg/m2  SpO2 100%  HEMODYNAMICS:    VENTILATOR SETTINGS:    INTAKE / OUTPUT: I/O last 3 completed shifts: In: 2107.5 [I.V.:2007.5; IV Piggyback:100] Out: 1190 [Urine:990; Blood:200]  PHYSICAL EXAMINATION: General:  Elderly male in NAD Neuro:  Awake, alert, pleasantly confused - oriented to self, home address but unable to state location HEENT:  MM pink/moist, no jvd, bulbous nose with deformity, multiple warts / skin lesions behind R ear Cardiovascular:  s1s2 rrr, no m/r/g Lungs:  resp's even/non-labored, lungs bilaterally clear  Abdomen:  NTND, bsx4 active  Musculoskeletal:  No  acute deformities, MAE, equal strength Skin:  Warm/dry, no edema   LABS:  CBC No results for input(s): WBC, HGB, HCT, PLT in the last 168 hours.   Coag's No results for input(s): APTT, INR in the last 168 hours.   BMET  Recent Labs Lab 01/26/15 0914  NA 138  K  4.8  CL 109  CO2 21*  BUN 19  CREATININE 1.24  GLUCOSE 141*   Electrolytes  Recent Labs Lab 01/26/15 0914  CALCIUM 8.4*  MG 1.7   Sepsis Markers No results for input(s): LATICACIDVEN, PROCALCITON, O2SATVEN in the last 168 hours.   ABG No results for input(s): PHART, PCO2ART, PO2ART in the last 168 hours.   Liver Enzymes No results for input(s): AST, ALT, ALKPHOS, BILITOT, ALBUMIN in the last 168 hours.   Cardiac Enzymes No results for input(s): TROPONINI, PROBNP in the last 168 hours.   Glucose No results for input(s): GLUCAP in the last 168 hours.  Imaging Dg Lumbar Spine 2-3 Views  01/25/2015  CLINICAL DATA:  Patient status post L2-4 laminectomy. EXAM: DG C-ARM 61-120 MIN; LUMBAR SPINE - 2-3 VIEW COMPARISON:  MRI lumbar spine 12/26/2014 FINDINGS: Two intraoperative fluoroscopic images were submitted for interpretation. Postsurgical hardware demonstrated posterior to the L4-5 interspace. Multilevel degenerative changes of the lumbar spine. IMPRESSION: Postsurgical hardware demonstrated posterior to the L4-5 interspace. Electronically Signed   By: Lovey Newcomer M.D.   On: 01/25/2015 18:18   Dg C-arm 1-60 Min  01/25/2015  CLINICAL DATA:  Patient status post L2-4 laminectomy. EXAM: DG C-ARM 61-120 MIN; LUMBAR SPINE - 2-3 VIEW COMPARISON:  MRI lumbar spine 12/26/2014 FINDINGS: Two intraoperative fluoroscopic images were submitted for interpretation. Postsurgical hardware demonstrated posterior to the L4-5 interspace. Multilevel degenerative changes of the lumbar spine. IMPRESSION: Postsurgical hardware demonstrated posterior to the L4-5 interspace. Electronically Signed   By: Lovey Newcomer M.D.   On: 01/25/2015 18:18     STUDIES:    CULTURES:   ANTIBIOTICS:   SIGNIFICANT EVENTS: 11/28  Admit for planned surgical decompression   LINES/TUBES:   DISCUSSION: 81 y/o M with PMH of alzheimer's dementia & chronic back pain who was admitted for planned surgical decompression  of severe spondylitic stenosis of L2-L3, L3-L4.  Post-operative period notable for PVC's and PCCM consulted.  ASSESSMENT / PLAN:  NEUROLOGIC A:   S/P Decompression of L2-L3, L3-L4  Alzheimer's Dementia - baseline short term memory problems At Risk Delirium  P:   RASS goal: n/a Frequent orientation  Promote sleep / wake cycle to prevent ICU delirium  Neurovascular checks  Post-operative care per NSGY PT efforts SCD's for DVT prophylaxis  PULMONARY A: At risk atelectasis  P:   Pulmonary hygiene as able  Upright positioning   CARDIOVASCULAR A:  PVC's / Trigeminy - r/o ACS Hx HTN P:  ICU monitoring  Continue norvasc for HTN Replace magnesium  EKG reviewed Consider Cardiology consult if PVC's continue after electrolyte replacement  Assess troponin to r/o ACS, if positive, would have Cardiology evaluate  RENAL A:   Hypomagnesemia  P:   Replace Mg+ Follow BMP / UOP   GASTROINTESTINAL A:   Chronic Constipation  P:   Add bowel regimen with narcotics Diet as tolerated  Minimize narcotics as able  FAMILY  - Updates: Wife, daughter and son updated at bedside 11/29  - Inter-disciplinary family meet or Palliative Care meeting due by:  12/4     Noe Gens, NP-C [facility] Pgr: [phone] or if no answer  [id number] 01/26/2015, 12:30 PM  Attending Note:  81 year old male with history of dementia presenting to PCCM after L2, 3 and 4 decompression for spondylitic stenosis where he was being monitored in the ICU and was noted to have frequent PVCs. PCCM was called. On exam, patient is complaining of constipation and back pain but nothing else. Lungs are clear on exam. I reviewed back CXR myself, L4-% hardware noted. Discussed with PCCM NP and bedside RN.  PVC's: frequent, no known cardiac history however, trigeminy not PVCs. - EKG ordered. - Extended lytes  - Correct electrolytes as  ordered. - If worsens will need involvement of cardiology. - Check troponins and if positive then call cards. - No need for anti-arrhythmics for now.  Constipation: chronic. - Bowel regiment ordered, miralax, senokot and dolculax ordered. - No disimpaction right now.  Hypomag: - Replace Mg. - Recheck in AM.  HTN: - Home norvasc dose added.  DVT prophylaxis: - SCD's added.  Electrolytes to be corrected, if patient continues to be in trigeminy after correction of electrolytes then would recommend calling cardiology to evaluate. PCCM will be available PRN.  Patient seen and examined, agree with above note. I dictated the care and orders written for this patient under my direction.  Rush Farmer, MD [phone].

## 2015-01-26 NOTE — NC FL2 (Signed)
[place] MEDICAID FL2 LEVEL OF CARE SCREENING TOOL     IDENTIFICATION  Patient Name: Anthony Tyler Birthdate: 1931-02-01 Sex: male Admission Date (Current Location): 01/25/2015  [facility] and [place] Number: Herbalist and Address:  [facility] [place], [place] [address] [place], [place] [address]      Provider Number: [id number]  Attending Physician Name and Address:  Kristeen Miss, MD  Relative Name and Phone Number:       Current Level of Care: Hospital Recommended Level of Care: [place] Prior Approval Number:    Date Approved/Denied:   PASRR Number:    Discharge Plan: SNF    Current Diagnoses: Patient Active Problem List   Diagnosis Date Noted  . Spinal stenosis of lumbar region with neurogenic claudication 01/25/2015  . Peptic ulcer disease 08/04/2013  . Essential hypertension, benign 08/03/2013  . Acute blood loss anemia 08/03/2013  . GI bleed 08/02/2013  . Benign Cecal Adenoma-Lap Asst Right Colectomy September 2013 11/02/2011  . Rhinophyma 11/02/2011    Orientation ACTIVITIES/SOCIAL BLADDER RESPIRATION    Self    Indwelling catheter O2 (As needed) (1 L [place])  BEHAVIORAL SYMPTOMS/MOOD NEUROLOGICAL BOWEL NUTRITION STATUS      Continent Diet (clear liquid)  PHYSICIAN VISITS COMMUNICATION OF NEEDS Height & Weight Skin    Verbally 6\' 2"  (188 cm) 180 lbs. Surgical wounds          AMBULATORY STATUS RESPIRATION    Assist extensive O2 (As needed) (1 L [place])      Personal Care Assistance Level of Assistance  Bathing, Dressing Bathing Assistance: Maximum assistance   Dressing Assistance: Maximum assistance      Functional Limitations Info                SPECIAL CARE FACTORS FREQUENCY  PT (By licensed PT)     PT Frequency: 5/wk             Additional Factors Info  Code Status, Allergies Code Status Info: FULL Allergies Info: NKA           Current Medications (01/26/2015):  This is the  current hospital active medication list Current Facility-Administered Medications  Medication Dose Route Frequency Provider Last Rate Last Dose  . 0.9 %  sodium chloride infusion  250 mL Intravenous Continuous Kristeen Miss, MD      . 0.9 %  sodium chloride infusion   Intravenous Continuous Kristeen Miss, MD 75 mL/hr at 01/26/15 1158    . acetaminophen (TYLENOL) tablet 650 mg  650 mg Oral Q4H PRN Kristeen Miss, MD       Or  . acetaminophen (TYLENOL) suppository 650 mg  650 mg Rectal Q4H PRN Kristeen Miss, MD      . alum & mag hydroxide-simeth (MAALOX/MYLANTA) 200-200-20 MG/5ML suspension 30 mL  30 mL Oral Q6H PRN Kristeen Miss, MD      . amLODipine (NORVASC) tablet 10 mg  10 mg Oral Daily Kristeen Miss, MD   10 mg at 01/26/15 1018  . bisacodyl (DULCOLAX) suppository 10 mg  10 mg Rectal Daily PRN Donita Brooks, NP      . HYDROcodone-acetaminophen (NORCO/VICODIN) 5-325 MG per tablet 1-2 tablet  1-2 tablet Oral Q4H PRN Kristeen Miss, MD      . HYDROmorphone (DILAUDID) injection 0.5-1 mg  0.5-1 mg Intravenous Q2H PRN Kristeen Miss, MD      . ketorolac (TORADOL) 15 MG/ML injection 15 mg  15 mg Intravenous 4 times per day Mallie Mussel  Elsner, MD   15 mg at 01/26/15 1154  . lactated ringers infusion   Intravenous Continuous Kristeen Miss, MD 10 mL/hr at 01/25/15 1156    . magnesium sulfate IVPB 2 g 50 mL  2 g Intravenous Once Donita Brooks, NP   2 g at 01/26/15 1300  . menthol-cetylpyridinium (CEPACOL) lozenge 3 mg  1 lozenge Oral PRN Kristeen Miss, MD       Or  . phenol (CHLORASEPTIC) mouth spray 1 spray  1 spray Mouth/Throat PRN Kristeen Miss, MD      . methocarbamol (ROBAXIN) tablet 500 mg  500 mg Oral Q6H PRN Kristeen Miss, MD       Or  . methocarbamol (ROBAXIN) 500 mg in dextrose 5 % 50 mL IVPB  500 mg Intravenous Q6H PRN Kristeen Miss, MD      . ondansetron [facility]) injection 4 mg  4 mg Intravenous Q4H PRN Kristeen Miss, MD      . oxyCODONE-acetaminophen (PERCOCET/ROXICET) 5-325 MG per tablet 1-2 tablet  1-2  tablet Oral Q4H PRN Kristeen Miss, MD   2 tablet at 01/26/15 [phone]  . polyethylene glycol (MIRALAX / GLYCOLAX) packet 17 g  17 g Oral Daily Donita Brooks, NP   17 g at 01/26/15 1341  . senna-docusate (Senokot-S) tablet 1 tablet  1 tablet Oral BID Donita Brooks, NP   1 tablet at 01/26/15 1341  . sodium chloride 0.9 % injection 3 mL  3 mL Intravenous Q12H Kristeen Miss, MD   3 mL at 01/26/15 0800  . sodium chloride 0.9 % injection 3 mL  3 mL Intravenous PRN Kristeen Miss, MD         Discharge Medications: Please see discharge summary for a list of discharge medications.  Relevant Imaging Results:  Relevant Lab Results:  Recent Labs    Additional Information SS#: [id number]  Cranford Mon, LCSW

## 2015-01-26 NOTE — Consult Note (Signed)
CARDIOLOGY CONSULT NOTE   Patient ID: Anthony Tyler MRN: [id number], DOB/AGE: 1930/10/27   Admit date: 01/25/2015 Date of Consult: 01/26/2015 Reason for  Consult: PVC's  Primary Physician: Donnie Coffin, MD Primary Cardiologist: New  HPI: Anthony Tyler is a 81 y.o. male with past medical history of HTN, Glaucoma, lumbar stenosis and Alzheimer's Disease who presented to [facility] ED on 01/25/2015 for neurologic claudication. He underwent surgical decompression with bilateral laminotomies and foraminotomies of L2-L3 and L4-L5 by Dr. Ellene Route.  He tolerated the procedure well. Post-operatively telemetry has shown ventricular trigeminy and cardiology is asked to see the patient.  His magnesium was checked earlier today and noted to be at the lower end of normal with a value of 1.7. It has since been replaced. His BMET shows no acute abnormalities.  He denies any history of palpitations, chest pain, or shortness of breath. His wife supports these statements as well saying he has never complained of such symptoms. She reports the only thing he has mentioned is having back pain.  His wife reports he has never been seen by a cardiologist before and has never required cardiac evaluation. He has been diagnosed with HTN recently but she says it has been well-controlled since starting medications this past year.   Problem List Past Medical History  Diagnosis Date  . Glaucoma     BOTH EYES  . Partial blindness     RIGHT EYE  . Enlargement of the nose     OUTER NOSE VERY ENLARGED--STATES HE SOMETIMES MASHES BUMPS ON THE NOSE AND BLOOD WILL COME OUT OF BUMPS.  PT DOES NOT HAVE PCP--SAW A DERMATOLOGIST ONCE--BUT STOPPED THE "PILLS" HE WAS GIVEN-DIDN'T LIKE THE WAY THE MED MADE HIM FEEL  . Hypertension     takes Amlodipine daily  . Weakness     numbness and tingling  . Edema     in feet   . Alzheimer disease     1st stage  . Chronic back pain     stenosis  . Inguinal hernia    left  . Urinary frequency   . Urinary urgency   . Hemorrhoids   . Dry eyes   . Warts     multiple on right ear lobe    Past Surgical History  Procedure Laterality Date  . Cataract extraction Bilateral 03/2011  . Eye surgery      RETINAL SURGERY RT EYE FOR EYE HEMORRHAGE--THEN HAD CATARACT REMOVED RT EYE  . Tonsillectomy      AGE 16  . Hemicolectomy    . Esophagogastroduodenoscopy N/A 08/04/2013    Procedure: ESOPHAGOGASTRODUODENOSCOPY (EGD);  Surgeon: Wonda Horner, MD;  Location: [facility];  Service: Endoscopy;  Laterality: N/A;  . Hernia repair Right 2008  . Colonoscopy    . Lumbar laminectomy with coflex 2 level N/A 01/25/2015    Procedure: Lumbar Two-Three, Lumbar Three-Four Laminectomy with coflex;  Surgeon: Kristeen Miss, MD;  Location: [facility];  Service: Neurosurgery;  Laterality: N/A;  L2-3 L3-4 Laminectomy with coflex     Allergies No Known Allergies    Inpatient Medications . amLODipine  10 mg Oral Daily  . ketorolac  15 mg Intravenous 4 times per day  . polyethylene glycol  17 g Oral Daily  . senna-docusate  1 tablet Oral BID  . sodium chloride  3 mL Intravenous Q12H    Family History Family History  Problem Relation Age of Onset  . Stroke Mother  Social History Social History   Social History  . Marital Status: Married    Spouse Name: N/A  . Number of Children: N/A  . Years of Education: N/A   Occupational History  . Not on file.   Social History Main Topics  . Smoking status: Never Smoker   . Smokeless tobacco: Never Used  . Alcohol Use: No  . Drug Use: No  . Sexual Activity: Not on file   Other Topics Concern  . Not on file   Social History Narrative     Review of Systems General:  No chills, fever, night sweats or weight changes.  Cardiovascular:  No chest pain, dyspnea on exertion, edema, orthopnea, palpitations, paroxysmal nocturnal dyspnea. Dermatological: No rash, lesions/masses Respiratory: No cough,  dyspnea Urologic: No hematuria, dysuria Abdominal:   No nausea, vomiting, diarrhea, bright red blood per rectum, melena, or hematemesis Neurologic:  No visual changes, wkns, changes in mental status. Positive for back pain. All other systems reviewed and are otherwise negative except as noted above.  Physical Exam Blood pressure 100/53, pulse 80, temperature 98.5 F (36.9 C), temperature source Oral, resp. rate 13, height 6\' 2"  (1.88 m), weight 180 lb (81.647 kg), SpO2 96 %.  General: Pleasant, elderly Caucasian male in NAD Psych: Normal affect. Neuro: Alert and oriented X 3. Moves all extremities spontaneously. HEENT: Normal  Neck: Supple without bruits or JVD. Lungs:  Resp regular and unlabored, CTA without wheezing or rales. Heart: RRR no s3, s4, or murmurs. Abdomen: Soft, non-tender, non-distended, BS + x 4.  Extremities: No clubbing, cyanosis or edema. DP/PT/Radials 2+ and equal bilaterally.  Labs   Recent Labs  01/26/15 1515  TROPONINI <0.03   Lab Results  Component Value Date   WBC 7.6 01/18/2015   HGB 13.5 01/18/2015   HCT 40.7 01/18/2015   MCV 99.5 01/18/2015   PLT 164 01/18/2015    Recent Labs Lab 01/26/15 0914  NA 138  K 4.8  CL 109  CO2 21*  BUN 19  CREATININE 1.24  CALCIUM 8.4*  GLUCOSE 141*    Radiology/Studies Dg Lumbar Spine 2-3 Views: 01/25/2015  CLINICAL DATA:  Patient status post L2-4 laminectomy. EXAM: DG C-ARM 61-120 MIN; LUMBAR SPINE - 2-3 VIEW COMPARISON:  MRI lumbar spine 12/26/2014 FINDINGS: Two intraoperative fluoroscopic images were submitted for interpretation. Postsurgical hardware demonstrated posterior to the L4-5 interspace. Multilevel degenerative changes of the lumbar spine. IMPRESSION: Postsurgical hardware demonstrated posterior to the L4-5 interspace. Electronically Signed   By: Lovey Newcomer M.D.   On: 01/25/2015 18:18   Dg C-arm 1-60 Min: 01/25/2015  CLINICAL DATA:  Patient status post L2-4 laminectomy. EXAM: DG C-ARM 61-120 MIN;  LUMBAR SPINE - 2-3 VIEW COMPARISON:  MRI lumbar spine 12/26/2014 FINDINGS: Two intraoperative fluoroscopic images were submitted for interpretation. Postsurgical hardware demonstrated posterior to the L4-5 interspace. Multilevel degenerative changes of the lumbar spine. IMPRESSION: Postsurgical hardware demonstrated posterior to the L4-5 interspace. Electronically Signed   By: Lovey Newcomer M.D.   On: 01/25/2015 18:18    ECG: 1st degree AV block; Rate in 80's.  ECHOCARDIOGRAM: Pending   ASSESSMENT AND PLAN  1. Ventricular Trigeminy  - patient is asymptomatic and denies any chest pain or palpitations. No known cardiac history. - initial troponin value negative and cyclic values are pending. EKG shows no evidence of acute ischemic changes. - will obtain echocardiogram for further assessment.  2. HTN - BP has been 94/51 - 140/123 in the past 24 hours. - continue Amlodipine 10mg  daily  3. S/p surgical decompression  - per admitting team   Signed, Erma Heritage, PA-C 01/26/2015, 4:58 PM Pager: [phone] Patient seen and examined and history reviewed. Agree with above findings and plan. 81 yo WM without prior cardiac history is seen for evaluation of PVCs post op spinal decompression. Patient denies any palpitations, chest pain, dyspnea. On telemetry noted to have PVCs with some trigeminy, very rare couplet and triplet. Potassium is ok. Magnesium 1.7. Ecg and troponin are normal. Will check Echo. If LV function is normal then no further treatment needed. If ectopy becomes symptomatic could consider a beta blocker.   Rayansh Herbst [place], [place] 01/26/2015 5:22 PM

## 2015-01-26 NOTE — Clinical Social Work Note (Signed)
Clinical Social Work Assessment  Patient Details  Name: Anthony Tyler MRN: [id number] Date of Birth: [birth year]  Date of referral:  01/26/15               Reason for consult:  Facility Placement                Permission sought to share information with:  Family Supports Permission granted to share information::  Yes, Verbal Permission Granted  Name::     Scharline [place]seille  Relationship::  Spouse  Contact Information:  [phone]  Housing/Transportation Living arrangements for the past 2 months:  Single Family Home Source of Information:  Adult Children, Spouse Patient Interpreter Needed:  None Criminal Activity/Legal Involvement Pertinent to Current Situation/Hospitalization:  No - Comment as needed Significant Relationships:  Adult Children, Spouse Lives with:  Spouse Do you feel safe going back to the place where you live?  Yes Need for family participation in patient care:  Yes (Comment)  Care giving concerns:  Patient wife states that due to patient baseline cognitive status, he does not adapt well to new environments.  Patient wife is hesitant with SNF placement, however understanding that patient cannot be managed safely at home without additional assistance.   Social Worker assessment / plan:  Holiday representative met with patient family at bedside to offer support and discuss patient needs at discharge.  Patient family states that patient is currently living at home with his wife (who just had a recent knee replacement).  Patient cognitive and physical limitations are a barrier to patient discharge home.  Patient family in agreement that patient could benefit from ST-SNF prior to return home.  CSW initiated bed search with preference to [facility] per family request.  Patient is clinically appropriate per facility, however awaiting discharge date to determine bed availability.  CSW to follow up with patient family regarding available bed offers.  CSW remains available for  support and to facilitate patient discharge needs once medically stable.  Employment status:  Retired Nurse, adult PT Recommendations:  [place] / Referral to community resources:  [place]  Patient/Family's Response to care:  Patient family verbalized understanding and appreciation for CSW support and involvement.  Patient family agreeable with SNF placement at discharge.  Patient/Family's Understanding of and Emotional Response to Diagnosis, Current Treatment, and Prognosis:  Patient wife does not exhibit realistic expectations for patient recovery and return home, however patient has four children who seem to all be in agreement and realistic about patient potential needs.  Emotional Assessment Appearance:  Appears stated age Attitude/Demeanor/Rapport:  Unable to Assess Affect (typically observed):  Unable to Assess Orientation:  Oriented to Self Alcohol / Substance use:  Not Applicable Psych involvement (Current and /or in the community):  No (Comment)  Discharge Needs  Concerns to be addressed:  Discharge Planning Concerns Readmission within the last 30 days:  No Current discharge risk:  Physical Impairment, Cognitively Impaired Barriers to Discharge:  Continued Medical Work up  [employer], [place]

## 2015-01-26 NOTE — Care Management Note (Signed)
Case Management Note  Patient Details  Name: Anthony Tyler MRN: [id number] Date of Birth: 18-Jan-1931  Subjective/Objective:    Pt admitted on 01/25/15 s/p bilateral laminectomies and decompression.  PTA, pt resides at home with his spouse, who is his caregiver.  Pt has Alzheimer's Disease.                 Action/Plan: PT recommending SNF at dc for rehab.  CSW consulted to facilitate possible dc to SNF when medically stable for dc.  Will follow progress.   Expected Discharge Date:                  Expected Discharge Plan:  Skilled Nursing Facility  In-House Referral:  Clinical Social Work  Discharge planning Services  CM Consult  Post Acute Care Choice:    Choice offered to:     DME Arranged:    DME Agency:     HH Arranged:    [place] Agency:     Status of Service:  In process, will continue to follow  Medicare Important Message Given:    Date Medicare IM Given:    Medicare IM give by:    Date Additional Medicare IM Given:    Additional Medicare Important Message give by:     If discussed at [place] of Stay Meetings, dates discussed:    Additional Comments:  Reinaldo Raddle, RN, BSN  Trauma/Neuro ICU Case Manager [phone]

## 2015-01-26 NOTE — Progress Notes (Signed)
Pt noted to be in trigeminy and quadrigeminy. Dr. Ellene Route notified and orders received for EKG and labs and plan for CCM consult. Will continue to monitor.

## 2015-01-27 ENCOUNTER — Inpatient Hospital Stay (HOSPITAL_COMMUNITY): Payer: Commercial Managed Care - HMO

## 2015-01-27 DIAGNOSIS — R410 Disorientation, unspecified: Secondary | ICD-10-CM

## 2015-01-27 DIAGNOSIS — G309 Alzheimer's disease, unspecified: Secondary | ICD-10-CM

## 2015-01-27 DIAGNOSIS — I10 Essential (primary) hypertension: Secondary | ICD-10-CM

## 2015-01-27 DIAGNOSIS — F0280 Dementia in other diseases classified elsewhere without behavioral disturbance: Secondary | ICD-10-CM | POA: Diagnosis present

## 2015-01-27 LAB — AMMONIA: AMMONIA: 27 umol/L (ref 9–35)

## 2015-01-27 LAB — TROPONIN I: Troponin I: <0.03 ng/mL (ref <0.031)

## 2015-01-27 MED ORDER — LORAZEPAM 2 MG/ML IJ SOLN
1.0000 mg | Freq: Once | INTRAMUSCULAR | Status: AC
  Administered 2015-01-27: 2 mg via INTRAVENOUS
  Filled 2015-01-27: qty 1

## 2015-01-27 MED ORDER — QUETIAPINE FUMARATE 25 MG PO TABS: 25.0000 mg | ORAL_TABLET | ORAL | Status: AC

## 2015-01-27 MED ORDER — QUETIAPINE FUMARATE 25 MG PO TABS
25.0000 mg | ORAL_TABLET | Freq: Two times a day (BID) | ORAL | Status: DC
  Filled 2015-01-27 (×2): qty 1

## 2015-01-27 MED ORDER — QUETIAPINE FUMARATE 50 MG PO TABS
50.0000 mg | ORAL_TABLET | Freq: Every day | ORAL | Status: DC
  Administered 2015-01-27: 50 mg via ORAL
  Filled 2015-01-27: qty 1

## 2015-01-27 NOTE — Progress Notes (Signed)
Pt restless, trying to get out of bed, pulling off leads, and interfering with care. Attempts to reorient pt have been unsuccessful. MD notified. New orders received. Will carry out and continue to monitor.

## 2015-01-27 NOTE — Progress Notes (Signed)
Patient ID: Anthony Tyler, male   DOB: 01-13-1931, 81 y.o.   MRN: [id number] Signs have been stable however patient is very confused this morning He has been requiring restraints and has pulled out IVs and his condom cath. He has not been able to cooperate with physical therapy this morning According to his wife and daughter he was lucid last night prior to being moved to stepdown Currently he is confused though he will answer questions and follows simple commands but is easily distracted by his condom catheter. I will ask neurology to evaluate for this new onset of confusion and apparent early signs of dementia that were noted at home He also may need some long-term placement in skilled nursing facility as in his current state I don't believe his wife will be able to care for him at home Will reattempt PT if patient is more lucid

## 2015-01-27 NOTE — Evaluation (Signed)
Occupational Therapy Evaluation Patient Details Name: Anthony Tyler MRN: [id number] DOB: [birth year] Today's Date: 01/27/2015    History of Present Illness pt presents with L2-4 Lami.  pt with hx of Alzheimer's, Visual Deficits in R Eye, Enlargement of the Nose, and HTN.     Clinical Impression   Patient presenting with decreased ADL and functional mobility independence secondary to above and secondary to decreased cognition since hospital admission. Patient independent to mod I PTA per chart. Patient currently functioning at an overall min to mod assist level (+2 for OOB mobility per PT), requiring multiple multimodal cueing for initiation and to participate. Patient will benefit from acute OT to increase overall independence in the areas of ADLs, functional mobility, and overall safety in order to safely discharge to venue listed below.     Follow Up Recommendations  SNF;Supervision/Assistance - 24 hour    Equipment Recommendations  Other (comment) (TBD)    Recommendations for Other Services  None at this time   Precautions / Restrictions Precautions Precautions: Fall;Back Precaution Booklet Issued: No Precaution Comments: pt too confused to continue to educate on back precautions.   Required Braces or Orthoses: Spinal Brace Spinal Brace: Lumbar corset;Applied in sitting position Restrictions Weight Bearing Restrictions: No    Mobility Bed Mobility Overal bed mobility: Needs Assistance Bed Mobility: Sidelying to Sit;Sit to Sidelying   Sidelying to sit: Min assist;HOB elevated     Sit to sidelying: Mod assist;HOB elevated General bed mobility comments: Therapist had to initiate each bed mobility movement. Pt only requiring min assist for actual activity once he started moving into that position, sitting or supine.   Transfers - Per PT note Overall transfer level: Needs assistance Equipment used: Rolling walker (2 wheeled);2 person hand held assist Transfers: Sit  to/from Stand Sit to Stand: Mod assist;+2 physical assistance General transfer comment: did not occur with OT    Balance Overall balance assessment: Needs assistance Sitting-balance support: No upper extremity supported;Feet supported Sitting balance-Leahy Scale: Fair     Standing balance support: Single extremity supported;Bilateral upper extremity supported;During functional activity Standing balance-Leahy Scale: Poor Standing balance comment: did not occur    ADL Overall ADL's : Needs assistance/impaired Eating/Feeding: Total assistance;Bed level   Grooming: Sitting;Moderate assistance Grooming Details (indicate cue type and reason): cues and hand over hand for initiation Upper Body Bathing: Moderate assistance Upper Body Bathing Details (indicate cue type and reason): cues and hand over hand for initiation Lower Body Bathing: Total assistance Lower Body Bathing Details (indicate cue type and reason): due to cognition Upper Body Dressing : Moderate assistance;Sitting Upper Body Dressing Details (indicate cue type and reason): cues and hand over hand for initiation Lower Body Dressing: Moderate assistance     Toilet Transfer Details (indicate cue type and reason): did not occur   Toileting - Clothing Manipulation Details (indicate cue type and reason): did not occur   Tub/Shower Transfer Details (indicate cue type and reason): did not occur   General ADL Comments: Pt with poor cognition which impacts his independence with ADLs and functional mobility    Vision Additional Comments: unsure          Pertinent Vitals/Pain Pain Assessment: Faces Faces Pain Scale: No hurt     Hand Dominance  (unsure)   Extremity/Trunk Assessment Upper Extremity Assessment Upper Extremity Assessment: Generalized weakness   Lower Extremity Assessment Lower Extremity Assessment: Generalized weakness   Cervical / Trunk Assessment Cervical / Trunk Assessment: Normal   Communication  Communication Communication: No difficulties  Cognition Arousal/Alertness: Awake/alert Behavior During Therapy: Restless;Impulsive Overall Cognitive Status: Impaired/Different from baseline Area of Impairment: Orientation;Attention;Memory;Following commands;Safety/judgement;Awareness;Problem solving Orientation Level: Disoriented to;Place;Time;Situation (able to state full name) Current Attention Level: Focused Memory: Decreased short-term memory;Decreased recall of precautions Following Commands: Follows one step commands inconsistently Safety/Judgement: Decreased awareness of safety;Decreased awareness of deficits Awareness: Intellectual Problem Solving: Slow processing;Decreased initiation;Difficulty sequencing;Requires verbal cues;Requires tactile cues General Comments: Pt restless this afternoon. Following one step commands inconsistanty, requring multimodal cues to stay on task and listen to therapist.                Home Living Family/patient expects to be discharged to:: Private residence Living Arrangements: Spouse/significant other Available Help at Discharge: Family;Available 24 hours/day Type of Home: House Home Access: Level entry     Home Layout: One level (son notes single step down to an extra room)     Bathroom Shower/Tub:  (unsure)   Bathroom Toilet:  (unsure)     Home Equipment: Kasandra Knudsen - single point   Additional Comments: Per son pt and wife are home alone and that he is unsure how much family can A them.        Prior Functioning/Environment Level of Independence: Independent with assistive device(s) (occassional used cane when not feeling well)        Comments: All above information in entered from PT's entry, pt unable to answer these questions.    OT Diagnosis: Generalized weakness;Cognitive deficits   OT Problem List: Decreased strength;Decreased activity tolerance;Impaired balance (sitting and/or standing);Decreased safety awareness;Decreased  knowledge of use of DME or AE;Decreased knowledge of precautions;Pain;Decreased cognition   OT Treatment/Interventions: Self-care/ADL training;Therapeutic exercise;Energy conservation;DME and/or AE instruction;Therapeutic activities;Patient/family education;Balance training    OT Goals(Current goals can be found in the care plan section) Acute Rehab OT Goals Patient Stated Goal: none stated, pt unable OT Goal Formulation: Patient unable to participate in goal setting Time For Goal Achievement: 02/10/15 Potential to Achieve Goals: Fair ADL Goals Pt Will Perform Grooming: with set-up;sitting (EOB, no cueing) Pt Will Transfer to Toilet: with min assist;stand pivot transfer;bedside commode (minimal cueing ) Additional ADL Goal #1: Pt will be able to verbalize 3/3 back precautions with minimal assistance Additional ADL Goal #2: Pt will be able to follow 3 step command consistantly with supervision for physical components  OT Frequency: Min 2X/week   Barriers to D/C: unsure   End of Session Equipment Utilized During Treatment: Back brace (when perform sit to/from stands or transfers)  Activity Tolerance: Patient tolerated treatment well Patient left: in bed;with call bell/phone within reach;with restraints reapplied   Time: [id number] OT Time Calculation (min): 19 min Charges:  OT General Charges $OT Visit: 1 Procedure OT Evaluation $Initial OT Evaluation Tier I: 1 Procedure  Sherryl Valido , MS, OTR/L, CLT Pager: [phone]  01/27/2015, 3:58 PM

## 2015-01-27 NOTE — Consult Note (Signed)
NEURO HOSPITALIST CONSULT NOTE   Requestig physician: Dr. Ellene Route   Reason for Consult: confusion after surgery in patient with known Dementia and blindness.   HPI:                                                                                                                                          Anthony Tyler is an 81 y.o. male who has known dementia for > 1 year and has been on medication in the past for this but due to alergic reaction this was stopped.  At home he is able to navigate and do ADLS but family cares for him, cooks his meals, he does not drive and has hallucinations while at home. He underwent a surgical decompression with bilateral laminotomies and foraminotomies of L2-L3 and L4-L5 by Dr. Ellene Route 2 days ago.  Prior to surgery he was his baseline. Yesterday he knew who his daughter and wife were but was slightly agitated.  Today he continues to attempt to get out of bed, is confused and unable to recognize his daughter.  HE is very agitated and requiring 3 point restraintes. Daughter notes he likely did not sleep well last night.   Past Medical History  Diagnosis Date  . Glaucoma     BOTH EYES  . Partial blindness     RIGHT EYE  . Enlargement of the nose     OUTER NOSE VERY ENLARGED--STATES HE SOMETIMES MASHES BUMPS ON THE NOSE AND BLOOD WILL COME OUT OF BUMPS.  PT DOES NOT HAVE PCP--SAW A DERMATOLOGIST ONCE--BUT STOPPED THE "PILLS" HE WAS GIVEN-DIDN'T LIKE THE WAY THE MED MADE HIM FEEL  . Hypertension     takes Amlodipine daily  . Weakness     numbness and tingling  . Edema     in feet   . Alzheimer disease     1st stage  . Chronic back pain     stenosis  . Inguinal hernia     left  . Urinary frequency   . Urinary urgency   . Hemorrhoids   . Dry eyes   . Warts     multiple on right ear lobe    Past Surgical History  Procedure Laterality Date  . Cataract extraction Bilateral 03/2011  . Eye surgery      RETINAL SURGERY RT EYE FOR EYE  HEMORRHAGE--THEN HAD CATARACT REMOVED RT EYE  . Tonsillectomy      AGE 35  . Hemicolectomy    . Esophagogastroduodenoscopy N/A 08/04/2013    Procedure: ESOPHAGOGASTRODUODENOSCOPY (EGD);  Surgeon: Wonda Horner, MD;  Location: [facility];  Service: Endoscopy;  Laterality: N/A;  . Hernia repair Right 2008  . Colonoscopy    . Lumbar laminectomy with coflex 2 level N/A 01/25/2015  Procedure: Lumbar Two-Three, Lumbar Three-Four Laminectomy with coflex;  Surgeon: Kristeen Miss, MD;  Location: [facility];  Service: Neurosurgery;  Laterality: N/A;  L2-3 L3-4 Laminectomy with coflex    Family History  Problem Relation Age of Onset  . Stroke Mother     Social History:  reports that he has never smoked. He has never used smokeless tobacco. He reports that he does not drink alcohol or use illicit drugs.  No Known Allergies  MEDICATIONS:                                                                                                                     Prior to Admission:  Prescriptions prior to admission  Medication Sig Dispense Refill Last Dose  . acetaminophen (TYLENOL) 500 MG tablet Take 500 mg by mouth every 6 (six) hours as needed for pain. PAIN   01/24/2015 at Unknown time  . amLODipine (NORVASC) 10 MG tablet Take 10 mg by mouth daily.   01/25/2015 at 0900  . Menthol-Methyl Salicylate (MUSCLE RUB) 10-15 % CREA Apply 1 application topically as needed for muscle pain.   More than a month at Unknown time   Scheduled: . amLODipine  10 mg Oral Daily  . polyethylene glycol  17 g Oral Daily  . senna-docusate  1 tablet Oral BID  . sodium chloride  3 mL Intravenous Q12H   [id number] **OR** acetaminophen, alum & mag hydroxide-simeth, bisacodyl, HYDROcodone-acetaminophen, HYDROmorphone (DILAUDID) injection, menthol-cetylpyridinium **OR** phenol, methocarbamol **OR** methocarbamol (ROBAXIN)  IV, ondansetron (ZOFRAN) IV, oxyCODONE-acetaminophen, sodium chloride   ROS:                                                                                                                                        History obtained from unobtainable from patient due to mental status    Blood pressure 156/106, pulse 103, temperature 98.2 F (36.8 C), temperature source Oral, resp. rate 15, height 6\' 2"  (1.88 m), weight 81.647 kg (180 lb), SpO2 95 %.   Neurologic Examination:  HEENT-  Normocephalic, no lesions, without obvious abnormality.  Normal external eye and conjunctiva.  Normal TM's bilaterally.  Normal auditory canals and external ears. Normal external nose, mucus membranes and septum.  Normal pharynx. Cardiovascular- S1, S2 normal, pulses palpable throughout   Lungs- chest clear, no wheezing, rales, normal symmetric air entry Abdomen- normal findings: bowel sounds normal Extremities- no edema Lymph-no adenopathy palpable Musculoskeletal-no joint tenderness, deformity or swelling Skin-warm and dry, no hyperpigmentation, vitiligo, or suspicious lesions  Neurological Examination Mental Status: Alert, not oriented and attempting to get out of bed.   Speech minimal but shows no dysarthria or aphasia.  Cannot follow commands due to confusion.  Cranial Nerves: II: blind in right eye and minimal vision in right eye, pupils equal, round, non reactive to light  III,IV, VI: ptosis not present, extra-ocular motions intact bilaterally V,VII: face symmetric, facial light touch sensation normal bilaterally VIII: hearing normal bilaterally IX,X: uvula rises symmetrically XI: bilateral shoulder shrug XII: midline tongue extension Motor: Right : Upper extremity   5/5    Left:     Upper extremity   5/5  Lower extremity   5/5     Lower extremity   5/5 Tone and bulk:normal tone throughout; no atrophy noted Sensory: Pinprick and light touch intact throughout, bilaterally Deep Tendon Reflexes: 1+  and symmetric throughout UE and no LE DTR Plantars: Mute bilaterally Cerebellar: Unable to assess       Lab Results: Basic Metabolic Panel:  Recent Labs Lab 01/26/15 0914  NA 138  K 4.8  CL 109  CO2 21*  GLUCOSE 141*  BUN 19  CREATININE 1.24  CALCIUM 8.4*  MG 1.7    Liver Function Tests: No results for input(s): AST, ALT, ALKPHOS, BILITOT, PROT, ALBUMIN in the last 168 hours. No results for input(s): LIPASE, AMYLASE in the last 168 hours. No results for input(s): AMMONIA in the last 168 hours.  CBC: No results for input(s): WBC, NEUTROABS, HGB, HCT, MCV, PLT in the last 168 hours.  Cardiac Enzymes:  Recent Labs Lab 01/26/15 1515 01/26/15 1818 01/27/15 0025  TROPONINI <0.03 <0.03 <0.03    Lipid Panel: No results for input(s): CHOL, TRIG, HDL, CHOLHDL, VLDL, LDLCALC in the last 168 hours.  CBG: No results for input(s): GLUCAP in the last 168 hours.  Microbiology: Results for orders placed or performed during the hospital encounter of 01/18/15  Surgical pcr screen     Status: None   Collection Time: 01/18/15 11:19 AM  Result Value Ref Range Status   MRSA, PCR NEGATIVE NEGATIVE Final   Staphylococcus aureus NEGATIVE NEGATIVE Final    Comment:        The Xpert SA Assay (FDA approved for NASAL specimens in patients over 39 years of age), is one component of a comprehensive surveillance program.  Test performance has been validated by [facility] for patients greater than or equal to 3 year old. It is not intended to diagnose infection nor to guide or monitor treatment.     Coagulation Studies: No results for input(s): LABPROT, INR in the last 72 hours.  Imaging: Dg Lumbar Spine 2-3 Views  01/25/2015  CLINICAL DATA:  Patient status post L2-4 laminectomy. EXAM: DG C-ARM 61-120 MIN; LUMBAR SPINE - 2-3 VIEW COMPARISON:  MRI lumbar spine 12/26/2014 FINDINGS: Two intraoperative fluoroscopic images were submitted for interpretation. Postsurgical  hardware demonstrated posterior to the L4-5 interspace. Multilevel degenerative changes of the lumbar spine. IMPRESSION: Postsurgical hardware demonstrated posterior to the L4-5 interspace. Electronically Signed  By: Lovey Newcomer M.D.   On: 01/25/2015 18:18   Dg C-arm 1-60 Min  01/25/2015  CLINICAL DATA:  Patient status post L2-4 laminectomy. EXAM: DG C-ARM 61-120 MIN; LUMBAR SPINE - 2-3 VIEW COMPARISON:  MRI lumbar spine 12/26/2014 FINDINGS: Two intraoperative fluoroscopic images were submitted for interpretation. Postsurgical hardware demonstrated posterior to the L4-5 interspace. Multilevel degenerative changes of the lumbar spine. IMPRESSION: Postsurgical hardware demonstrated posterior to the L4-5 interspace. Electronically Signed   By: Lovey Newcomer M.D.   On: 01/25/2015 18:18       Assessment and plan per attending neurologist  Etta Quill PA-C [facility] [phone]  01/27/2015, 10:42 AM   Assessment/Plan:  81 YO male with known Alzheimer and almost totally blind.  Patient underwent back surgery 2 days prior and has become more confused and agitated over the last two days.  Neuro exam is non-focal. Given the recent stressor of surgery, new location and lack of sleep this likely represents acute delirium in setting of hospitalization.    Recommend: 1) Seroquel 25 mg at 0800 and 1300 then 50 mg at 2000 2) once calm would recommend CT head and EEG 3) would decrease use of Ativan as this can cause agitation in elderly

## 2015-01-27 NOTE — Progress Notes (Signed)
Pt transferred to 3S14 due to bed acuity needs. I attempted to call wife to notify about the transfer but unable to reach wife at this time.

## 2015-01-27 NOTE — Progress Notes (Signed)
Physical Therapy Treatment Patient Details Name: Anthony Tyler MRN: [id number] DOB: [birth year] Today's Date: 01/27/2015    History of Present Illness pt presents with L2-4 Lami.  pt with hx of Alzheimer's, Visual Deficits in R Eye, Enlargement of the Nose, and HTN.      PT Comments    Pt impulsive and easily agitated today.  Pt not following any re-direction for participation or safety.  Wife and daughter present, but pt not following their directions either.  2 person A for all mobility and safety.  Continue to feel pt will need SNF level of care at D/C for safety.    Follow Up Recommendations  SNF     Equipment Recommendations  None recommended by PT    Recommendations for Other Services       Precautions / Restrictions Precautions Precautions: Fall;Back Precaution Booklet Issued: No Precaution Comments: pt too confused and agitated to continue to educate on back precautions.   Required Braces or Orthoses: Spinal Brace Spinal Brace: Lumbar corset;Applied in sitting position Restrictions Weight Bearing Restrictions: No    Mobility  Bed Mobility Overal bed mobility: Needs Assistance Bed Mobility: Sidelying to Sit;Sit to Sidelying   Sidelying to sit: Mod assist;HOB elevated     Sit to sidelying: Mod assist;+2 for physical assistance General bed mobility comments: pt not following cues, but when PT initiates mobility pt then followed with coming to sit, but needed increased A to return to bed.    Transfers Overall transfer level: Needs assistance Equipment used: Rolling walker (2 wheeled);2 person hand held assist Transfers: Sit to/from Stand Sit to Stand: Mod assist;+2 physical assistance         General transfer comment: pt impulsive and reaching out for RW and furniture to try to pull himself to standing.  pt needs facilitation at hips for flexion and returning to sitting.    Ambulation/Gait                 Stairs            Wheelchair  Mobility    Modified Rankin (Stroke Patients Only)       Balance Overall balance assessment: Needs assistance Sitting-balance support: Single extremity supported;Bilateral upper extremity supported;Feet supported Sitting balance-Leahy Scale: Fair     Standing balance support: Single extremity supported;Bilateral upper extremity supported;During functional activity Standing balance-Leahy Scale: Poor                      Cognition Arousal/Alertness: Awake/alert Behavior During Therapy: Agitated;Restless;Impulsive Overall Cognitive Status: Impaired/Different from baseline Area of Impairment: Orientation;Attention;Memory;Following commands;Safety/judgement;Awareness;Problem solving Orientation Level: Disoriented to;Place;Time;Situation Current Attention Level: Focused Memory: Decreased short-term memory;Decreased recall of precautions Following Commands: Follows one step commands inconsistently Safety/Judgement: Decreased awareness of safety;Decreased awareness of deficits Awareness: Intellectual Problem Solving: Slow processing;Decreased initiation;Difficulty sequencing;Requires verbal cues;Requires tactile cues General Comments: pt restless and easily agitated this am.  pt unable to be re-directed to task for participation.      Exercises      General Comments        Pertinent Vitals/Pain Pain Assessment: No/denies pain    Home Living                      Prior Function            PT Goals (current goals can now be found in the care plan section) Acute Rehab PT Goals Patient Stated Goal: Per son for pt to walk before returning  to home.   PT Goal Formulation: With patient/family Time For Goal Achievement: 02/09/15 Potential to Achieve Goals: Good Progress towards PT goals: Progressing toward goals    Frequency  Min 5X/week    PT Plan Current plan remains appropriate    Co-evaluation             End of Session Equipment Utilized During  Treatment: Gait belt;Back brace Activity Tolerance: Treatment limited secondary to agitation Patient left: in bed;with call bell/phone within reach;with family/visitor present;with restraints reapplied     Time: [id number] PT Time Calculation (min) (ACUTE ONLY): 24 min  Charges:  $Therapeutic Activity: 23-37 mins                    G CodesCatarina Hartshorn, [place] 01/27/2015, 12:13 PM

## 2015-01-27 NOTE — Progress Notes (Signed)
Thank you for consult on Mr. Roberg. He has baseline dementia, lacks medical complexity and has been unable to participate in therapy. A gree with PT recommendations for SNF level therapy after discharge. Will defer CIR consult for now.

## 2015-01-27 NOTE — Progress Notes (Signed)
    Subjective:  Pt is awake, in no distress but confused  Objective:  Vital Signs in the last 24 hours: Temp:  [98 F (36.7 C)-98.7 F (37.1 C)] 98.7 F (37.1 C) (11/30 0515) Pulse Rate:  [43-102] 43 (11/30 0515) Resp:  [11-28] 16 (11/30 0515) BP: (100-156)/(50-123) 156/79 mmHg (11/30 0515) SpO2:  [90 %-100 %] 95 % (11/30 0515)  Intake/Output from previous day:  Intake/Output Summary (Last 24 hours) at 01/27/15 0749 Last data filed at 01/27/15 0200  Gross per 24 hour  Intake   1253 ml  Output   1705 ml  Net   -452 ml    Physical Exam: General appearance: alert, no distress and confused, restrained Lungs: clear to auscultation bilaterally Heart: regular rate and rhythm Skin: cool and dry Neurologic: disoriented, restrained   Rate: 110  Rhythm: sinus tachycardia and premature ventricular contractions (PVC)  Lab Results: No results for input(s): WBC, HGB, PLT in the last 72 hours.  Recent Labs  01/26/15 0914  NA 138  K 4.8  CL 109  CO2 21*  GLUCOSE 141*  BUN 19  CREATININE 1.24    Recent Labs  01/26/15 1818 01/27/15 0025  TROPONINI <0.03 <0.03   No results for input(s): INR in the last 72 hours.  Scheduled Meds: . amLODipine  10 mg Oral Daily  . polyethylene glycol  17 g Oral Daily  . senna-docusate  1 tablet Oral BID  . sodium chloride  3 mL Intravenous Q12H   Continuous Infusions: . sodium chloride    . sodium chloride Stopped (01/27/15 0100)  . lactated ringers 10 mL/hr at 01/25/15 1156   PRN Meds:.acetaminophen **OR** acetaminophen, alum & mag hydroxide-simeth, bisacodyl, HYDROcodone-acetaminophen, HYDROmorphone (DILAUDID) injection, menthol-cetylpyridinium **OR** phenol, methocarbamol **OR** methocarbamol (ROBAXIN)  IV, ondansetron (ZOFRAN) IV, oxyCODONE-acetaminophen, sodium chloride   Imaging: Imaging results have been reviewed  Cardiac Studies: Echo is pending  Assessment/Plan:  81 y.o. male with past medical history of HTN,  Glaucoma, lumbar stenosis and Alzheimer's Disease admitted 01/25/2015 for neurologic claudication. He underwent surgical decompression with bilateral laminotomies and foraminotomies of L2-L3 and L4-L5 by Dr. Ellene Route. He tolerated the procedure well. Post-operatively telemetry has shown ventricular trigeminy and cardiology was asked to see the patient. Pt has no history of CAD or prior cardiac evaluation.    Principal Problem:   Spinal stenosis of lumbar region with neurogenic claudication Active Problems:   Premature ventricular contractions (PVCs) (VPCs)   Essential hypertension   Alzheimer's dementia   PLAN: Post op tachycardia not inappropriate. He continues to have PVCs but no couplets or NSVT. Echo is pending.  Kerin Ransom PA-C 01/27/2015, 7:49 AM [phone]  Patient seen and examined and history reviewed. Agree with above findings and plan. Echo reviewed. troponins all normal. Study Conclusions  - Left ventricle: The cavity size was normal. Wall thickness was normal. Systolic function was normal. The estimated ejection fraction was in the range of 55% to 60%. Wall motion was normal; there were no regional wall motion abnormalities.   With normal Echo and asymptomatic PVCs no further treatment is needed. Will sign off- please call with questions.  Nevaen Tredway [place], [place] 01/27/2015 3:24 PM

## 2015-01-27 NOTE — Progress Notes (Signed)
CM received consult regarding home health needs, medication needs, equipment. Per PT's evaluation/ recommendations, SNF level of care @ d/c. CM made referral to CSW for SNF placement. CM to f/u with disposition needs. Whitman Hero RN,BSN,CM [phone]

## 2015-01-27 NOTE — Progress Notes (Signed)
  Echocardiogram 2D Echocardiogram has been performed.  Anthony Tyler 01/27/2015, 12:39 PM

## 2015-01-27 NOTE — Progress Notes (Signed)
Restraints started(posey belt ) due to Pt getting out of bed frequently risking falling. [facility]  MD notified of restraint need hence ordered. Will continue to monitor.

## 2015-01-27 NOTE — Progress Notes (Signed)
Kenard Gower, RN EEG to be completed tomorrow 01/28/15 when patient less agitated.

## 2015-01-28 ENCOUNTER — Inpatient Hospital Stay (HOSPITAL_COMMUNITY): Payer: Commercial Managed Care - HMO

## 2015-01-28 ENCOUNTER — Encounter (HOSPITAL_COMMUNITY): Payer: Self-pay | Admitting: Radiology

## 2015-01-28 DIAGNOSIS — M4806 Spinal stenosis, lumbar region: Principal | ICD-10-CM

## 2015-01-28 LAB — URINE MICROSCOPIC-ADD ON

## 2015-01-28 LAB — URINALYSIS, ROUTINE W REFLEX MICROSCOPIC
BILIRUBIN URINE: NEGATIVE
Glucose, UA: NEGATIVE mg/dL
Ketones, ur: 15 mg/dL — AB
Leukocytes, UA: NEGATIVE
NITRITE: NEGATIVE
PROTEIN: NEGATIVE mg/dL
Specific Gravity, Urine: 1.013 (ref 1.005–1.030)
pH: 5 (ref 5.0–8.0)

## 2015-01-28 MED ORDER — QUETIAPINE FUMARATE 25 MG PO TABS
12.5000 mg | ORAL_TABLET | Freq: Two times a day (BID) | ORAL | Status: DC
  Administered 2015-01-28 – 2015-01-30 (×4): 12.5 mg via ORAL
  Filled 2015-01-28 (×3): qty 1

## 2015-01-28 MED ORDER — QUETIAPINE FUMARATE 25 MG PO TABS
12.5000 mg | ORAL_TABLET | Freq: Every day | ORAL | Status: DC
  Administered 2015-01-28 – 2015-01-29 (×2): 12.5 mg via ORAL
  Filled 2015-01-28 (×2): qty 1

## 2015-01-28 NOTE — Progress Notes (Signed)
Patient ID: Anthony Tyler, male   DOB: 1930-12-20, 81 y.o.   MRN: [id number] Currently, patient is resting comfortably. Arouses with painful stimulation. Patient has not been oriented Moving all 4 extremities vigorously when agitated Incision on back is clean and dry Wife is upset from episode of finding patient in room yesterday slid down in bed and wet. His dementia seems to be the biggest problem at this point He has not been able to cooperate with physical therapy to ambulate Cardiac status has been stable We'll transfer him to neuro floor with sitter. Hopefully removal of monitoring devices will help with patient becoming more oriented as effects of Seroquel will allow Continue supportive care Transfer to 5 C

## 2015-01-28 NOTE — Care Management Important Message (Signed)
Important Message  Patient Details  Name: Anthony Tyler MRN: [id number] Date of Birth: [birth year]   Medicare Important Message Given:  Yes    Salimah Martinovich [place] 01/28/2015, 1:07 PM

## 2015-01-28 NOTE — Progress Notes (Signed)
Patient transferred from unit 3S to room 5C21 at this time. Alert and in stable condition.

## 2015-01-28 NOTE — Progress Notes (Signed)
Physical Therapy Treatment Patient Details Name: Anthony Tyler MRN: [id number] DOB: 1930-12-02 Today's Date: 01/28/2015    History of Present Illness pt presents with L2-4 Lami.  pt with hx of Alzheimer's, Visual Deficits in R Eye, Enlargement of the Nose, and HTN.      PT Comments    Anthony Tyler continues to be confused w/ limited verbalizations but ambulated around bed in room w/ min +2 A using RW.  Pt requires min>mod A for bed mobility.  Pt will benefit from continued skilled PT services to increase functional independence and safety.   Follow Up Recommendations  SNF     Equipment Recommendations  None recommended by PT    Recommendations for Other Services       Precautions / Restrictions Precautions Precautions: Fall;Back Precaution Booklet Issued: No Precaution Comments: discussed proper technique w/ mobility to protect back Required Braces or Orthoses: Spinal Brace Spinal Brace: Lumbar corset;Applied in sitting position Restrictions Weight Bearing Restrictions: No    Mobility  Bed Mobility Overal bed mobility: Needs Assistance;+2 for physical assistance Bed Mobility: Sidelying to Sit;Sit to Sidelying;Rolling Rolling: Min assist;+2 for physical assistance Sidelying to sit: Mod assist;HOB elevated     Sit to sidelying: Mod assist;+2 for physical assistance General bed mobility comments: pt not following cues, but when PT initiates mobility pt then followed with coming to sit and to return to supine at end of session.      Transfers Overall transfer level: Needs assistance Equipment used: Rolling walker (2 wheeled);2 person hand held assist Transfers: Sit to/from Stand Sit to Stand: +2 physical assistance;Min assist         General transfer comment: Hand over hand tactile cues for correct hand placement and A to power up to standing and remain steady.  Pt appears anxious about sitting down on bed after ambulating, requiring increased time and verbal along  w/ tactile cues to reach back and sit down.  Ambulation/Gait Ambulation/Gait assistance: Min assist;+2 physical assistance;+2 safety/equipment Ambulation Distance (Feet): 10 Feet Assistive device: Rolling walker (2 wheeled) Gait Pattern/deviations: Step-through pattern;Shuffle;Antalgic;Trunk flexed;Decreased stride length   Gait velocity interpretation: <1.8 ft/sec, indicative of risk for recurrent falls General Gait Details: Trunk flexed despite tactile and verbal cues for upright posture.  A maneuvering RW as well as remaining steady while ambulating around bed in room.     Stairs            Wheelchair Mobility    Modified Rankin (Stroke Patients Only)       Balance Overall balance assessment: Needs assistance Sitting-balance support: No upper extremity supported;Feet supported Sitting balance-Leahy Scale: Fair Sitting balance - Comments: Min guard assist for pt's safety   Standing balance support: Bilateral upper extremity supported;During functional activity Standing balance-Leahy Scale: Poor Standing balance comment: A and use of RW for support                    Cognition Arousal/Alertness: Awake/alert Behavior During Therapy: Restless;Impulsive Overall Cognitive Status: Impaired/Different from baseline Area of Impairment: Orientation;Attention;Memory;Following commands;Safety/judgement;Awareness;Problem solving Orientation Level: Disoriented to;Place;Time;Situation (only states name clearly) Current Attention Level: Focused Memory: Decreased short-term memory;Decreased recall of precautions Following Commands: Follows one step commands inconsistently Safety/Judgement: Decreased awareness of safety;Decreased awareness of deficits Awareness: Intellectual Problem Solving: Slow processing;Decreased initiation;Difficulty sequencing;Requires verbal cues;Requires tactile cues General Comments: Pt continues to reach for condom catheter and requires verbal and  hand over hand cues to redirect to task at hand.  Pt mumbles and is difficult to understand.  Exercises      General Comments        Pertinent Vitals/Pain Pain Assessment: Faces Faces Pain Scale: Hurts little more Pain Location: low back? Pt does not state Pain Descriptors / Indicators: Grimacing (w/ sit>sidelying>supine) Pain Intervention(s): Limited activity within patient's tolerance;Monitored during session    Home Living                      Prior Function            PT Goals (current goals can now be found in the care plan section) Acute Rehab PT Goals PT Goal Formulation: With patient/family Time For Goal Achievement: 02/09/15 Potential to Achieve Goals: Good Progress towards PT goals: Progressing toward goals    Frequency  Min 5X/week    PT Plan Current plan remains appropriate    Co-evaluation             End of Session Equipment Utilized During Treatment: Gait belt;Back brace Activity Tolerance: Patient limited by fatigue (limited 2/2 confusion) Patient left: in bed;with call bell/phone within reach;with restraints reapplied;with nursing/sitter in room     Time: [id number] PT Time Calculation (min) (ACUTE ONLY): 26 min  Charges:  $Therapeutic Activity: 23-37 mins                    G Codes:      Joslyn Hy PT, DPT [phone] Pager: [phone] 01/28/2015, 4:30 PM

## 2015-01-28 NOTE — Progress Notes (Signed)
PT Cancellation Note  Patient Details Name: Anthony Tyler MRN: [id number] DOB: February 23, 1931   Cancelled Treatment:    Reason Eval/Treat Not Completed: Patient at procedure or test/unavailable.  Attempted to see pt, at procedure x2.  For CT now, will attempt to see again this afternoon, time permitting.  Joslyn Hy PT, DPT [phone] Pager: [phone] 01/28/2015, 11:19 AM

## 2015-01-28 NOTE — Progress Notes (Signed)
Pt found to be in new onset A-fib confirmed by 12 Lead EKG. HR 90s-110s with PVCs. BP stable. Pt appears in no acute distress and resting comfortably. Dr. [place] of Cardiology paged and notified. No orders received. Will continue to monitor and convey to Day RN for discussion with day team.

## 2015-01-28 NOTE — Progress Notes (Signed)
Subjective: Very sedated, but awakens to stimuli briefly.  Holding next dose of Seroquel.  EEG currently underway and shows no epileptiform activity.    Objective: Current vital signs: BP 110/56 mmHg  Pulse 80  Temp(Src) 99.8 F (37.7 C) (Oral)  Resp 10  Ht 6\' 2"  (1.88 m)  Wt 81.647 kg (180 lb)  BMI 23.10 kg/m2  SpO2 96% Vital signs in last 24 hours: Temp:  [98 F (36.7 C)-99.8 F (37.7 C)] 99.8 F (37.7 C) (12/01 0716) Pulse Rate:  [80-112] 80 (12/01 0716) Resp:  [8-23] 10 (12/01 0716) BP: (106-154)/(54-101) 110/56 mmHg (12/01 0716) SpO2:  [94 %-98 %] 96 % (12/01 0716)  Intake/Output from previous day:   Intake/Output this shift:   Nutritional status: DIET SOFT Room service appropriate?: Yes; Fluid consistency:: Thin  Neurologic Exam: General: NAD Mental Status: sedated Cranial Nerves: II: Visual fields grossly normal, pupils equal, round, reactive to light and accommodation III,IV, VI: ptosis not present, extra-ocular motions intact bilaterally V,VII: smile symmetric, facial light touch sensation normal bilaterally VIII: hearing normal bilaterally IX,X: uvula rises symmetrically XI: bilateral shoulder shrug XII: midline tongue extension without atrophy or fasciculations  Motor: Moving all extremities antigravity Sensory: Pinprick and light touch intact throughout, bilaterally Deep Tendon Reflexes:  1+ thoughout Plantars: Right: downgoing   Left: downgoing    Lab Results: Basic Metabolic Panel:  Recent Labs Lab 01/26/15 0914  NA 138  K 4.8  CL 109  CO2 21*  GLUCOSE 141*  BUN 19  CREATININE 1.24  CALCIUM 8.4*  MG 1.7    Liver Function Tests: No results for input(s): AST, ALT, ALKPHOS, BILITOT, PROT, ALBUMIN in the last 168 hours. No results for input(s): LIPASE, AMYLASE in the last 168 hours.  Recent Labs Lab 01/27/15 1300  AMMONIA 27    CBC: No results for input(s): WBC, NEUTROABS, HGB, HCT, MCV, PLT in the last 168 hours.  Cardiac  Enzymes:  Recent Labs Lab 01/26/15 1515 01/26/15 1818 01/27/15 0025  TROPONINI <0.03 <0.03 <0.03    Lipid Panel: No results for input(s): CHOL, TRIG, HDL, CHOLHDL, VLDL, LDLCALC in the last 168 hours.  CBG: No results for input(s): GLUCAP in the last 168 hours.  Microbiology: Results for orders placed or performed during the hospital encounter of 01/18/15  Surgical pcr screen     Status: None   Collection Time: 01/18/15 11:19 AM  Result Value Ref Range Status   MRSA, PCR NEGATIVE NEGATIVE Final   Staphylococcus aureus NEGATIVE NEGATIVE Final    Comment:        The Xpert SA Assay (FDA approved for NASAL specimens in patients over 82 years of age), is one component of a comprehensive surveillance program.  Test performance has been validated by [facility] for patients greater than or equal to 46 year old. It is not intended to diagnose infection nor to guide or monitor treatment.     Coagulation Studies: No results for input(s): LABPROT, INR in the last 72 hours.  Imaging: Ct Head Wo Contrast  01/28/2015  CLINICAL DATA:  Agitation EXAM: CT HEAD WITHOUT CONTRAST TECHNIQUE: Contiguous axial images were obtained from the base of the skull through the vertex without intravenous contrast. COMPARISON:  05/21/2014 brain MRI FINDINGS: Skull and Sinuses:Negative for fracture or destructive process. The visualized mastoids, middle ears, and imaged paranasal sinuses are clear. Orbits: No acute abnormality. Brain: No evidence of acute infarction, hemorrhage, hydrocephalus, or mass lesion/mass effect. Generalized cortical atrophy with stable congruent ventriculomegaly. Chronic small-vessel disease with  ischemic gliosis confluent in the deep cerebral white matter. IMPRESSION: No acute finding or change from 05/21/2014. Electronically Signed   By: Monte Fantasia M.D.   On: 01/28/2015 06:42   Dg Chest Port 1 View  01/27/2015  CLINICAL DATA:  Acute onset of confusion and aggression.   Fever? EXAM: PORTABLE CHEST 1 VIEW COMPARISON:  Chest x-ray dated 11/03/2011. FINDINGS: Study is hypoinspiratory with crowding of the perihilar and bibasilar bronchovascular markings. Given the low lung volumes, lungs appear clear. No evidence of pneumonia. No pleural effusion. No pneumothorax seen. Heart size is upper normal, unchanged. Overall cardiomediastinal silhouette remains within normal limits in size and configuration given the low lung volumes. At least mild degenerative change again noted within the cervical thoracic spine. No acute osseous abnormality seen. IMPRESSION: Low lung volumes.  No evidence of acute cardiopulmonary abnormality. Electronically Signed   By: Franki Cabot M.D.   On: 01/27/2015 12:16    Medications:  Scheduled: . amLODipine  10 mg Oral Daily  . polyethylene glycol  17 g Oral Daily  . QUEtiapine  25 mg Oral NOW  . QUEtiapine  25 mg Oral BID  . QUEtiapine  50 mg Oral Daily  . senna-docusate  1 tablet Oral BID  . sodium chloride  3 mL Intravenous Q12H    Assessment/Plan:  81 YO male with known Alzheimer and almost totally blind. Patient underwent back surgery 2 days prior and has become more confused and agitated over the last two days. Neuro exam is non-focal. Given the recent stressor of surgery, new location and lack of sleep this likely represents acute delirium in setting of hospitalization.   Recommend: - MRI brain as he is sedated at this time - EEG reading pending -will decrease doses of Seroquel and hold if too sedated.     Etta Quill PA-C [facility] [phone]  01/28/2015, 9:45 AM

## 2015-01-28 NOTE — Procedures (Signed)
ELECTROENCEPHALOGRAM REPORT   Patient: Anthony Tyler        Age: 81 y.o.        Sex: male Referring Physician: Dr Ellene Route Report Date:  01/28/2015        Interpreting Physician: Hulen Luster  History: Anthony Tyler is an 81 y.o. male hx of dementia now with altered mental status  Medications:  Scheduled: . amLODipine  10 mg Oral Daily  . polyethylene glycol  17 g Oral Daily  . QUEtiapine  12.5 mg Oral BID  . QUEtiapine  12.5 mg Oral Daily  . senna-docusate  1 tablet Oral BID  . sodium chloride  3 mL Intravenous Q12H    Conditions of Recording:  This is a 16 channel EEG carried out with the patient in the drowsy and asleep state.  Description:  There is profound suppression of the background activity. No posterior dominant alpha activity is noted. No focal slowing or epileptiform activity is noted. Normal sleep architecture is not observed.   Hyperventilation was not performed. Intermittent photic stimulation was not performed.   IMPRESSION: This is an abnormal EEG secondary to posterior background slowing. This finding may be seen with a diffuse gray matter disturbance that is etiologically nonspecific, but may include a dementia, among other possibilities. No epileptiform activity is noted.    Jim Like, DO [facility] [phone]  If 7pm- 7am, please page neurology on call as listed in [place]. 01/28/2015, 12:05 PM

## 2015-01-28 NOTE — Progress Notes (Signed)
EEG Completed; Results Pending  

## 2015-01-28 NOTE — Clinical Social Work Note (Signed)
CSW talked with wife at patient's room regarding information from admissions director at [facility].  Mrs. Schoenrock asked to contact [place], admissions director regarding completion of admissions paperwork.  Mrs. Triggs is very hopeful that she will be able to take patient home as she thinks a familiar environment will be better for her husband. However she is realistic and understands that if her husband needs rehab, he will d/c to [place], MSW, [place] Licensed Clinical Social Worker Clinical Social Work [place] [phone]

## 2015-01-29 DIAGNOSIS — F0280 Dementia in other diseases classified elsewhere without behavioral disturbance: Secondary | ICD-10-CM

## 2015-01-29 DIAGNOSIS — G309 Alzheimer's disease, unspecified: Secondary | ICD-10-CM

## 2015-01-29 MED ORDER — QUETIAPINE FUMARATE 25 MG PO TABS: 25.0000 mg | ORAL_TABLET | Freq: Two times a day (BID) | ORAL | Status: AC | PRN

## 2015-01-29 MED ORDER — HYDROCODONE-ACETAMINOPHEN 5-325 MG PO TABS: 1.0000 | ORAL_TABLET | Freq: Four times a day (QID) | ORAL | Status: DC | PRN

## 2015-01-29 NOTE — Progress Notes (Signed)
Physical Therapy Treatment Patient Details Name: Anthony Tyler MRN: [id number] DOB: [birth year] Today's Date: 01/29/2015    History of Present Illness pt presents with L2-4 Lami.  pt with hx of Alzheimer's, Visual Deficits in R Eye, Enlargement of the Nose, and HTN.      PT Comments    Patient alert on arrival. Remains confused and requires constant cues/assist for mobility. Patient with no sign of back pain (actually long-sitting in bed, reaching to his feet/socks on arrival). Able to ambulate in hall with min assist (2nd person follow with chair for safety).   Follow Up Recommendations  SNF     Equipment Recommendations  None recommended by PT    Recommendations for Other Services       Precautions / Restrictions Precautions Precautions: Fall;Back Precaution Booklet Issued: No Precaution Comments: requires constant verbal and tactile cues Required Braces or Orthoses: Spinal Brace Spinal Brace: Lumbar corset;Applied in sitting position Restrictions Weight Bearing Restrictions: No    Mobility  Bed Mobility Overal bed mobility: Needs Assistance;+2 for physical assistance Bed Mobility: Supine to Sit     Supine to sit: Min guard     General bed mobility comments: Pt long-sitting reaching down to his socks on arrival; stated he needed to use bathroom and turned to EOB; cues for safety  Transfers Overall transfer level: Needs assistance Equipment used: Rolling walker (2 wheeled);2 person hand held assist Transfers: Sit to/from Stand Sit to Stand: Min assist;+2 safety/equipment         General transfer comment: Hand over hand tactile cues for correct hand placement and remain steady. Increased time and verbal along w/ tactile cues to reach back and sit down.  Ambulation/Gait Ambulation/Gait assistance: Min assist;+2 safety/equipment Ambulation Distance (Feet): 200 Feet Assistive device: Rolling walker (2 wheeled) Gait Pattern/deviations: Step-through  pattern;Trunk flexed   Gait velocity interpretation: Below normal speed for age/gender General Gait Details: Trunk flexed (when RW pushed too far ahead) can correct with tactile and verbal cues for upright posture.  Assist maneuvering RW as well as remaining steady while ambulating.     Stairs            Wheelchair Mobility    Modified Rankin (Stroke Patients Only)       Balance   Sitting-balance support: No upper extremity supported;Feet supported Sitting balance-Leahy Scale: Fair     Standing balance support: Single extremity supported Standing balance-Leahy Scale: Poor Standing balance comment: stood at toilet x 4 minutes;                     Cognition Arousal/Alertness: Awake/alert Behavior During Therapy: Restless;Impulsive Overall Cognitive Status: Impaired/Different from baseline Area of Impairment: Orientation;Attention;Memory;Following commands;Safety/judgement;Awareness;Problem solving Orientation Level: Disoriented to;Place;Time;Situation Current Attention Level: Focused Memory: Decreased short-term memory;Decreased recall of precautions Following Commands: Follows one step commands inconsistently Safety/Judgement: Decreased awareness of safety;Decreased awareness of deficits Awareness: Intellectual Problem Solving: Slow processing;Difficulty sequencing;Requires verbal cues;Requires tactile cues      Exercises      General Comments        Pertinent Vitals/Pain Pain Assessment: Faces Faces Pain Scale: No hurt    Home Living                      Prior Function            PT Goals (current goals can now be found in the care plan section) Acute Rehab PT Goals Patient Stated Goal: none stated, pt unable Time For Goal  Achievement: 02/09/15 Progress towards PT goals: Progressing toward goals    Frequency  Min 5X/week    PT Plan Current plan remains appropriate    Co-evaluation             End of Session Equipment  Utilized During Treatment: Gait belt;Back brace Activity Tolerance: Patient tolerated treatment well;Other (comment) (limited 2/2 confusion) Patient left: with call bell/phone within reach;with nursing/sitter in room;in chair     Time: [id number] PT Time Calculation (min) (ACUTE ONLY): 21 min  Charges:  $Gait Training: 8-22 mins                    G Codes:      Anthony Tyler 23-Feb-2015, 9:13 AM Pager [phone]

## 2015-01-29 NOTE — Progress Notes (Signed)
Occupational Therapy Treatment Patient Details Name: Anthony Tyler MRN: [id number] DOB: June 16, 1930 Today's Date: 01/29/2015    History of present illness pt presents with L2-4 Lami.  pt with hx of Alzheimer's, Visual Deficits in R Eye, Enlargement of the Nose, and HTN.     OT comments  Pt alert and sitting up in chair on arrival. Pt required constant verbal cues and hand-under-hand assist to complete ADL tasks and for mobility. Pt required min-mod assist for transfers and ambulation during session. Order pt an activity mat as pt was continually attempting to get up and reach for objects.    Follow Up Recommendations  SNF;Supervision/Assistance - 24 hour    Equipment Recommendations  Other (comment) (TBD in next venue)    Recommendations for Other Services      Precautions / Restrictions Precautions Precautions: Fall;Back Precaution Booklet Issued: No Precaution Comments: requires constant verbal and tactile cues Required Braces or Orthoses: Spinal Brace Spinal Brace: Lumbar corset;Applied in sitting position Restrictions Weight Bearing Restrictions: No       Mobility Bed Mobility Overal bed mobility: Needs Assistance;+2 for physical assistance Bed Mobility: Supine to Sit     Supine to sit: Min guard     General bed mobility comments: Pt received in chair on arrival  Transfers Overall transfer level: Needs assistance Equipment used: Rolling walker (2 wheeled) Transfers: Sit to/from Stand Sit to Stand: Mod assist;+2 safety/equipment         General transfer comment: Mod assist for boost to stand and to regain balance upon standing. Hand-under-hand cues for hand placement on RW and to keep RW steady on the ground.    Balance Overall balance assessment: Needs assistance Sitting-balance support: No upper extremity supported;Feet supported Sitting balance-Leahy Scale: Fair Sitting balance - Comments: Min guard assist as pt attempting to reach down towards  floor   Standing balance support: Bilateral upper extremity supported;During functional activity Standing balance-Leahy Scale: Poor Standing balance comment: Stood at toilet x3 minutes and required min assist for balance and safety                   ADL Overall ADL's : Needs assistance/impaired     Grooming: Wash/dry hands;Wash/dry face;Maximal assistance;Cueing for sequencing;Sitting;Standing Grooming Details (indicate cue type and reason): Verbal cues and hand-over-hand for initation                 Toilet Transfer: Moderate assistance;+2 for safety/equipment;Cueing for safety;Cueing for sequencing;Ambulation;BSC;RW Toilet Transfer Details (indicate cue type and reason): Mod assist for balance while standing at [facility] to urinate. Hand-over-hand to terminate task - pt perseverating on task Toileting- Water quality scientist and Hygiene: Moderate assistance;Sit to/from stand         General ADL Comments: Pt's cognitive deficits and impulsivity put him at a high fall risk. Pt required mod assist for balance and safety during transfers and ambulation. Pt attempted to walk without AD x3 and required verbal cues and hand-udner-hand guidance to safely ambulate with RW. Pt perseverating on ADL tasks and required hand-under-hand to terminate tasks. Pt responds very well to hand-under-hand technique to switch attention and for ambulation. Ordered pt an activity mat to keep hands busy - pt picking at his gown, brace, and attempting to lean to the ground to pick up items.       Vision                     Perception     Praxis  Cognition   Behavior During Therapy: Restless;Impulsive Overall Cognitive Status: Impaired/Different from baseline Area of Impairment: Orientation;Attention;Memory;Following commands;Safety/judgement;Awareness;Problem solving Orientation Level: Disoriented to;Time;Situation Current Attention Level: Focused Memory: Decreased recall of  precautions;Decreased short-term memory  Following Commands: Follows one step commands inconsistently Safety/Judgement: Decreased awareness of safety;Decreased awareness of deficits Awareness: Intellectual Problem Solving: Slow processing;Difficulty sequencing;Requires verbal cues;Requires tactile cues      Extremity/Trunk Assessment               Exercises     Shoulder Instructions       General Comments      Pertinent Vitals/ Pain       Pain Assessment: Faces Faces Pain Scale: Hurts a little bit Pain Location: back? Pt unable to state Pain Descriptors / Indicators: Grimacing Pain Intervention(s): Limited activity within patient's tolerance;Monitored during session;Repositioned  Home Living                                          Prior Functioning/Environment              Frequency Min 2X/week     Progress Toward Goals  OT Goals(current goals can now be found in the care plan section)  Progress towards OT goals: Progressing toward goals  Acute Rehab OT Goals Patient Stated Goal: none stated, pt unable OT Goal Formulation: Patient unable to participate in goal setting Time For Goal Achievement: 02/10/15 Potential to Achieve Goals: Fair ADL Goals Pt Will Perform Grooming: with set-up;sitting Pt Will Transfer to Toilet: with min assist;stand pivot transfer;bedside commode Additional ADL Goal #1: Pt will be able to verbalize 3/3 back precautions with minimal assistance Additional ADL Goal #2: Pt will be able to follow 3 step command consistantly with supervision for physical components  Plan Discharge plan remains appropriate    Co-evaluation                 End of Session Equipment Utilized During Treatment: Gait belt;Rolling walker;Back brace   Activity Tolerance Patient tolerated treatment well   Patient Left in chair;with call bell/phone within reach;with nursing/sitter in room   Nurse Communication Mobility  status;Precautions;Other (comment) (Order activity mat)        Time: 1040-1105 OT Time Calculation (min): 25 min  Charges: OT General Charges $OT Visit: 1 Procedure OT Treatments $Self Care/Home Management : 23-37 mins  Redmond Baseman, OTR/L 01/29/2015, 12:11 PM

## 2015-01-29 NOTE — Clinical Social Work Note (Signed)
CSW attempted to reach patient's wife to discuss discharge plans at home - messages left and visited room, however wife not in room. CSW will request weekend CSW attempt to talk with wife.   Verda Mehta Givens, MSW, LCSW Licensed Clinical Social Worker [place] [phone]

## 2015-01-29 NOTE — Progress Notes (Signed)
CM spoke with Dr Ellene Route, who states that patient's wife prefers discharge to home vs. SNF.  CM attempted to speak with wife, who is no longer at bedside.  Patient is confused, so voicemail was left for wife requesting a return call. Bedside RN updated.  Lorne Skeens RN, MSN [phone]

## 2015-01-29 NOTE — Care Management Note (Addendum)
Case Management Note  Patient Details  Name: Anthony Tyler MRN: [id number] Date of Birth: 03/01/1930  Subjective/Objective:                    Action/Plan: Met with patient's wife, son and daughter at bedside to discuss home health needs.  Patient's wife has chosen [facility], which they have used in the past. Miranda with [facility] was notified and has accepted the referral for discharge home either tonight or tomorrow.  Patient has all necessary DME at home.  Expected Discharge Date:                  Expected Discharge Plan:  [place]  In-House Referral:     Discharge planning Services  CM Consult  Post Acute Care Choice:    Choice offered to:  Spouse  DME Arranged:    DME Agency:     HH Arranged:  PT, Nurse's Aide [place] Agency:     Status of Service:  Completed, signed off  Medicare Important Message Given:  Yes Date Medicare IM Given:    Medicare IM give by:    Date Additional Medicare IM Given:    Additional Medicare Important Message give by:     If discussed at [place] of Stay Meetings, dates discussed:    Additional Comments:  Rolm Baptise, RN 01/29/2015, 4:13 PM

## 2015-01-29 NOTE — Progress Notes (Addendum)
CM attempted to reach patient's wife a second time to discuss discharge planning. Call was answered, but then disconnected. CM was unable to get an answer on 2 attempts to call immediately after the disconnection.  CM will continue attempts to reach patient's wife.  Voicemail was left for daughter Earl Lites as well.  Lorne Skeens RN, MSN [phone]

## 2015-01-29 NOTE — Discharge Summary (Signed)
Physician Discharge Summary  Patient ID: Anthony Tyler MRN: [id number] DOB/AGE: Sep 03, 1930 81 y.o.  Admit date: 01/25/2015 Discharge date: 01/29/2015  Admission Diagnoses: Lumbar stenosis with neurogenic claudication and lumbar radiculopathy L2-3 and L3-4. History of constipation. History of dementia.  Discharge Diagnoses: Lumbar stenosis with neurogenic claudication and lumbar radiculopathy L2-3 and L3-4. Postoperative delirium. History of dementia. History of constipation. New-onset cardiac arrhythmia with frequent PVCs Principal Problem:   Spinal stenosis of lumbar region with neurogenic claudication Active Problems:   Premature ventricular contractions (PVCs) (VPCs)   Essential hypertension   Alzheimer's dementia   Discharged Condition: fair  Hospital Course:  patient was admitted to undergo surgical decompression at L2-3 and L3-4. Postoperatively the patient was severely demented. He required observation in intensive care unit as he was very agitated. This was treated medically. He was seen by neurology service in consultation. Gradually his delirium resolved however the patient has not been very ambulatory. His family would like to take him home.s: neurology  Significant Diagnostic Studies: CT had EEG   Treatments: physical therapy occupational therapy   Discharge Exam: Blood pressure 125/64, pulse 77, temperature 98.8 F (37.1 C), temperature source Oral, resp. rate 19, height 6\' 2"  (1.88 m), weight 81.647 kg (180 lb), SpO2 96 %. Incision/Wound: incision is clean and dry motor function is intact though gait is very unstable and lower extremities.   Disposition: 01-Home or Self Care  Discharge Instructions    Call MD for:  redness, tenderness, or signs of infection (pain, swelling, redness, odor or green/yellow discharge around incision site)    Complete by:  As directed      Call MD for:  severe uncontrolled pain    Complete by:  As directed      Call MD for:   temperature >100.4    Complete by:  As directed      Diet - low sodium heart healthy    Complete by:  As directed      Discharge instructions    Complete by:  As directed   Okay to shower. Do not apply salves or appointments to incision. No heavy lifting with the upper extremities greater than 15 pounds.     Increase activity slowly    Complete by:  As directed             Medication List    TAKE these medications        acetaminophen 500 MG tablet  Commonly known as:  TYLENOL  Take 500 mg by mouth every 6 (six) hours as needed for pain. PAIN     amLODipine 10 MG tablet  Commonly known as:  NORVASC  Take 10 mg by mouth daily.     HYDROcodone-acetaminophen 5-325 MG tablet  Commonly known as:  NORCO/VICODIN  Take 1 tablet by mouth every 6 (six) hours as needed for moderate pain.     MUSCLE RUB 10-15 % Crea  Apply 1 application topically as needed for muscle pain.     QUEtiapine 25 MG tablet  Commonly known as:  SEROQUEL  Take 1 tablet (25 mg total) by mouth 2 (two) times daily as needed (Agitation).         SignedEarleen Newport 01/29/2015, 5:32 PM

## 2015-01-29 NOTE — Progress Notes (Signed)
Subjective: Patient awake and not agitated. Per wife he has improved.  Still confused but has dementia at baseline.   Exam: Filed Vitals:   01/29/15 0611 01/29/15 1006  BP: 147/72 125/64  Pulse: 97 77  Temp: 99.1 F (37.3 C) 98.8 F (37.1 C)  Resp: 18 19    HEENT-  Normocephalic, no lesions, without obvious abnormality.  Normal external eye and conjunctiva.  Normal TM's bilaterally.  Normal auditory canals and external ears. Normal external nose, mucus membranes and septum.  Normal pharynx. Cardiovascular- S1, S2 normal, pulses palpable throughout   Lungs- chest clear, no wheezing, rales, normal symmetric air entry Abdomen- normal findings: bowel sounds normal Extremities- no edema    Gen: In bed, NAD MS: alert but not oriented. Follows simple visual and oral commands.  CN: 2-12 intact Motor: moving all extremities antigravity Sensory: intact throughout   Pertinent Labs: none  Etta Quill PA-C [facility] [phone]  Impression: 81 YO male with known Alzheimer and almost totally blind. Patient underwent back surgery 2 days prior and has become more confused and agitated over the last two days. Neuro exam is non-focal. Given the recent stressor of surgery, new location and lack of sleep this likely represents acute delirium in setting of hospitalization. Repeat CT and EEG shows no abnormality   Recommendations: Continue Seroquel at current dose while in hospital for agitation--may D/C when discharged.     01/29/2015, 10:24 AM

## 2015-01-30 NOTE — Progress Notes (Signed)
Pt d/c to home by car with family. Assessment stable. Prescriptions given. Instructed wife to give pt 1/2 tablet of seroquel per dr. Sherwood Gambler. All questions answered.

## 2015-01-30 NOTE — Clinical Social Work Note (Signed)
CSW received phone call from charge nurse, patient's wife is here and will transport her home.  CSW to sign off, please reconsult if other social work needs arise.  Jones Broom. Dyer, MSW, [place] 01/30/2015 9:54 AM

## 2015-01-30 NOTE — Discharge Instructions (Signed)
Spinal Fusion, Care After °Refer to this sheet in the next few weeks. These instructions provide you with information on caring for yourself after your procedure. Your caregiver may also give you more specific instructions. Your treatment has been planned according to current medical practices, but problems sometimes occur. Call your caregiver if you have any problems or questions after your procedure. °HOME CARE INSTRUCTIONS  °· Take whatever pain medicine has been prescribed by your caregiver. Do not take over-the-counter pain medicine unless directed otherwise by your caregiver. °· Do not drive if you are taking narcotic pain medicines. °· Change your bandage (dressing) if necessary or as directed by your caregiver. °· Do not get your surgical cut (incision) wet. After a few days you may take quick showers (rather than baths), but keep your incision clean and dry. Covering the incision with plastic wrap while you shower should keep your incision dry. A few weeks after surgery, once your incision has healed and your caregiver says it is okay, you can take baths or go swimming. °· If you have been prescribed medicine to prevent your blood from clotting, follow the directions carefully. °· Check the area around your incision often. Look for redness and swelling. Also, look for anything leaking from your wound. You can use a mirror or have a family member inspect your incision if it is in a place where it is difficult for you to see. °· Ask your caregiver what activities you should avoid and for how long. °· Walk as much as possible. °· Do not lift anything heavier than 10 pounds (4.5 kilograms) until your caregiver says it is safe. °· Do not twist or bend for a few weeks. Try not to pull on things. Avoid sitting for long periods of time. Change positions at least every hour. °· Ask your caregiver what kinds of exercise you should do to make your back stronger and when you should begin doing these exercises. °SEEK  IMMEDIATE MEDICAL CARE IF:  °· Pain suddenly becomes much worse. °· The incision area is red, swollen, bleeding, or leaking fluid. °· Your legs or feet become increasingly painful, numb, weak, or swollen. °· You have trouble controlling urination or bowel movements. °· You have trouble breathing. °· You have chest pain. °· You have a fever. °MAKE SURE YOU: °· Understand these instructions. °· Will watch your condition. °· Will get help right away if you are not doing well or get worse. °  °This information is not intended to replace advice given to you by your health care provider. Make sure you discuss any questions you have with your health care provider. °  °Document Released: 09/02/2004 Document Revised: 03/06/2014 Document Reviewed: 07/29/2014 °Elsevier Interactive Patient Education ©2016 [employer] ° °

## 2015-01-30 NOTE — Progress Notes (Signed)
Occupational Therapy Treatment Patient Details Name: DEL BLESSINGTON MRN: [id number] DOB: 1930-08-07 Today's Date: 01/30/2015    History of present illness pt presents with L2-4 Lami.  pt with hx of Alzheimer's, Visual Deficits in R Eye, Enlargement of the Nose, and HTN.     OT comments  Pt. Seen for skilled OT today with focus of session on wife completing family education and demonstrating safe techniques with assisting pt. With bed mobility and toileting tasks.  Wife able to resturn demo.  Both eager for d/c home set for this morning.    Follow Up Recommendations  SNF;Supervision/Assistance - 24 hour    Equipment Recommendations  Other (comment)    Recommendations for Other Services      Precautions / Restrictions Precautions Precautions: Fall;Back Precaution Comments: requires constant verbal and tactile cues Required Braces or Orthoses: Spinal Brace Spinal Brace: Lumbar corset;Applied in sitting position       Mobility Bed Mobility Overal bed mobility: Needs Assistance Bed Mobility: Rolling;Sidelying to Sit Rolling: Supervision Sidelying to sit: Supervision       General bed mobility comments: wife provided verbal cues and limited tactile guidance to initiate pts. rolling into side lying, hob flat, no rail and states he exits from left side  Transfers Overall transfer level: Needs assistance   Transfers: Sit to/from Stand;Stand Pivot Transfers Sit to Stand: Min guard Stand pivot transfers: Min guard       General transfer comment: verbal inst. from wife "scoot, stand up" ect. min guard assist from wife occasionaly touching his back    Balance                                   ADL Overall ADL's : Needs assistance/impaired                         Toilet Transfer: Min guard;Ambulation;Regular Glass blower/designer Details (indicate cue type and reason): pt. required intermittent vcs for pt. to walk towards the b.room, states he  uses a cane at home, today she had him use no DME for amb. Toileting- Clothing Manipulation and Hygiene: Minimal assistance;Sit to/from stand       Functional mobility during ADLs: Min guard General ADL Comments: focus of session was for spouse to assist with bed mobility and adl.  initally resistant and not wanting to participate "hell be fine and snap right back once we get home and he is around all his stuff"  explained them demonstrating these tasks together would ensure both of their safety.  she then walked over to side of bed and said "get up honey".  pt. responded to her cues and directions.      Vision                     Perception     Praxis      Cognition   Behavior During Therapy: [facility] for tasks assessed/performed                         Extremity/Trunk Assessment               Exercises     Shoulder Instructions       General Comments      Pertinent Vitals/ Pain       Pain Assessment: No/denies pain  Home Living  Prior Functioning/Environment              Frequency Min 2X/week     Progress Toward Goals  OT Goals(current goals can now be found in the care plan section)  Progress towards OT goals: Progressing toward goals     Plan Discharge plan remains appropriate    Co-evaluation                 End of Session Equipment Utilized During Treatment: Gait belt   Activity Tolerance Patient tolerated treatment well   Patient Left in bed;with call bell/phone within reach;with nursing/sitter in room;with family/visitor present   Nurse Communication          Time: 1000-1014 OT Time Calculation (min): 14 min  Charges: OT General Charges $OT Visit: 1 Procedure OT Treatments $Self Care/Home Management : 8-22 mins  Janice Coffin, COTA/L 01/30/2015, 10:31 AM

## 2015-02-01 ENCOUNTER — Encounter (HOSPITAL_COMMUNITY): Payer: Self-pay | Admitting: Emergency Medicine

## 2015-02-01 ENCOUNTER — Emergency Department (HOSPITAL_COMMUNITY): Payer: Commercial Managed Care - HMO

## 2015-02-01 ENCOUNTER — Emergency Department (HOSPITAL_COMMUNITY)
Admission: EM | Admit: 2015-02-01 | Discharge: 2015-02-01 | Disposition: A | Discharge status: Home / Self Care | Payer: Commercial Managed Care - HMO | Attending: Emergency Medicine | Admitting: Emergency Medicine

## 2015-02-01 DIAGNOSIS — Z79899 Other long term (current) drug therapy: Secondary | ICD-10-CM | POA: Insufficient documentation

## 2015-02-01 DIAGNOSIS — G8929 Other chronic pain: Secondary | ICD-10-CM | POA: Insufficient documentation

## 2015-02-01 DIAGNOSIS — R339 Retention of urine, unspecified: Secondary | ICD-10-CM | POA: Insufficient documentation

## 2015-02-01 DIAGNOSIS — K5900 Constipation, unspecified: Secondary | ICD-10-CM | POA: Diagnosis present

## 2015-02-01 DIAGNOSIS — K5901 Slow transit constipation: Secondary | ICD-10-CM

## 2015-02-01 DIAGNOSIS — G309 Alzheimer's disease, unspecified: Secondary | ICD-10-CM | POA: Insufficient documentation

## 2015-02-01 DIAGNOSIS — I10 Essential (primary) hypertension: Secondary | ICD-10-CM | POA: Insufficient documentation

## 2015-02-01 LAB — URINALYSIS, ROUTINE W REFLEX MICROSCOPIC
BILIRUBIN URINE: NEGATIVE
Glucose, UA: NEGATIVE mg/dL
KETONES UR: NEGATIVE mg/dL
LEUKOCYTES UA: NEGATIVE
NITRITE: NEGATIVE
PH: 6 (ref 5.0–8.0)
PROTEIN: NEGATIVE mg/dL
Specific Gravity, Urine: 1.019 (ref 1.005–1.030)

## 2015-02-01 LAB — URINE MICROSCOPIC-ADD ON

## 2015-02-01 MED ORDER — LACTULOSE 10 GM/15ML PO SOLN: 10.0000 g | Freq: Two times a day (BID) | ORAL | Status: AC | PRN

## 2015-02-01 MED ORDER — MAGNESIUM CITRATE PO SOLN: 1.0000 | Freq: Once | ORAL | Status: DC

## 2015-02-01 MED ORDER — OXYCODONE-ACETAMINOPHEN 5-325 MG PO TABS
1.0000 | ORAL_TABLET | Freq: Once | ORAL | Status: AC
  Administered 2015-02-01: 1 via ORAL
  Filled 2015-02-01: qty 1

## 2015-02-01 MED ORDER — TAMSULOSIN HCL 0.4 MG PO CAPS: 0.4000 mg | ORAL_CAPSULE | Freq: Every day | ORAL | Status: DC

## 2015-02-01 NOTE — Discharge Instructions (Signed)
Constipation, Adult Constipation is when a person:  Poops (has a bowel movement) less than 3 times a week.  Has a hard time pooping.  Has poop that is dry, hard, or bigger than normal. HOME CARE  1. Eat foods with a lot of fiber in them. This includes fruits, vegetables, beans, and whole grains such as brown rice. 2. Avoid fatty foods and foods with a lot of sugar. This includes french fries, hamburgers, cookies, candy, and soda. 3. If you are not getting enough fiber from food, take products with added fiber in them (supplements). 4. Drink enough fluid to keep your pee (urine) clear or pale yellow. 5. Exercise on a regular basis, or as told by your doctor. 6. Go to the restroom when you feel like you need to poop. Do not hold it. 7. Only take medicine as told by your doctor. Do not take medicines that help you poop (laxatives) without talking to your doctor first. GET HELP RIGHT AWAY IF:  1. You have bright red blood in your poop (stool). 2. Your constipation lasts more than 4 days or gets worse. 3. You have belly (abdominal) or butt (rectal) pain. 4. You have thin poop (as thin as a pencil). 5. You lose weight, and it cannot be explained. MAKE SURE YOU:  1. Understand these instructions. 2. Will watch your condition. 3. Will get help right away if you are not doing well or get worse.   This information is not intended to replace advice given to you by your health care provider. Make sure you discuss any questions you have with your health care provider.   Document Released: 08/02/2007 Document Revised: 03/06/2014 Document Reviewed: 11/25/2012 Elsevier Interactive Patient Education 2016 [place], Adult A Foley catheter is a soft, flexible tube that is placed into the bladder to drain urine. A Foley catheter may be inserted if:  You leak urine or are not able to control when you urinate (urinary incontinence).  You are not able to urinate when you need to  (urinary retention).  You had prostate surgery or surgery on the genitals.  You have certain medical conditions, such as multiple sclerosis, dementia, or a spinal cord injury. If you are going home with a Foley catheter in place, follow the instructions below. TAKING CARE OF THE CATHETER 8. Wash your hands with soap and water. 9. Using mild soap and warm water on a clean washcloth:  Clean the area on your body closest to the catheter insertion site using a circular motion, moving away from the catheter. Never wipe toward the catheter because this could sweep bacteria up into the urethra and cause infection.  Remove all traces of soap. Pat the area dry with a clean towel. For males, reposition the foreskin. 10. Attach the catheter to your leg so there is no tension on the catheter. Use adhesive tape or a leg strap. If you are using adhesive tape, remove any sticky residue left behind by the previous tape you used. 11. Keep the drainage bag below the level of the bladder, but keep it off the floor. 12. Check throughout the day to be sure the catheter is working and urine is draining freely. Make sure the tubing does not become kinked. 13. Do not pull on the catheter or try to remove it. Pulling could damage internal tissues. TAKING CARE OF THE DRAINAGE BAGS You will be given two drainage bags to take home. One is a large overnight drainage bag, and  the other is a smaller leg bag that fits underneath clothing. You may wear the overnight bag at any time, but you should never wear the smaller leg bag at night. Follow the instructions below for how to empty, change, and clean your drainage bags. Emptying the Drainage Bag You must empty your drainage bag when it is  - full or at least 2-3 times a day. 6. Wash your hands with soap and water. 7. Keep the drainage bag below your hips, below the level of your bladder. This stops urine from going back into the tubing and into your bladder. 8. Hold the  dirty bag over the toilet or a clean container. 9. Open the pour spout at the bottom of the bag and empty the urine into the toilet or container. Do not let the pour spout touch the toilet, container, or any other surface. Doing so can place bacteria on the bag, which can cause an infection. 10. Clean the pour spout with a gauze pad or cotton ball that has rubbing alcohol on it. 11. Close the pour spout. 12. Attach the bag to your leg with adhesive tape or a leg strap. 13. Wash your hands well. Changing the Drainage Bag Change your drainage bag once a month or sooner if it starts to smell bad or look dirty. Below are steps to follow when changing the drainage bag. 4. Wash your hands with soap and water. 5. Pinch off the rubber catheter so that urine does not spill out. 6. Disconnect the catheter tube from the drainage tube at the connection valve. Do not let the tubes touch any surface. 7. Clean the end of the catheter tube with an alcohol wipe. Use a different alcohol wipe to clean the end of the drainage tube. 8. Connect the catheter tube to the drainage tube of the clean drainage bag. 9. Attach the new bag to the leg with adhesive tape or a leg strap. Avoid attaching the new bag too tightly. 10. Wash your hands well. Cleaning the Drainage Bag 1. Wash your hands with soap and water. 2. Wash the bag in warm, soapy water. 3. Rinse the bag thoroughly with warm water. 4. Fill the bag with a solution of white vinegar and water (1 cup vinegar to 1 qt warm water [.2 L vinegar to 1 L warm water]). Close the bag and soak it for 30 minutes in the solution. 5. Rinse the bag with warm water. 6. Hang the bag to dry with the pour spout open and hanging downward. 7. Store the clean bag (once it is dry) in a clean plastic bag. 8. Wash your hands well. PREVENTING INFECTION  Wash your hands before and after handling your catheter.  Take showers daily and wash the area where the catheter enters your body.  Do not take baths. Replace wet leg straps with dry ones, if this applies.  Do not use powders, sprays, or lotions on the genital area. Only use creams, lotions, or ointments as directed by your caregiver.  For females, wipe from front to back after each bowel movement.  Drink enough fluids to keep your urine clear or pale yellow unless you have a fluid restriction.  Do not let the drainage bag or tubing touch or lie on the floor.  Wear cotton underwear to absorb moisture and to keep your skin drier. SEEK MEDICAL CARE IF:   Your urine is cloudy or smells unusually bad.  Your catheter becomes clogged.  You are not draining urine  into the bag or your bladder feels full.  Your catheter starts to leak. SEEK IMMEDIATE MEDICAL CARE IF:   You have pain, swelling, redness, or pus where the catheter enters the body.  You have pain in the abdomen, legs, lower back, or bladder.  You have a fever.  You see blood fill the catheter, or your urine is pink or red.  You have nausea, vomiting, or chills.  Your catheter gets pulled out. MAKE SURE YOU:   Understand these instructions.  Will watch your condition.  Will get help right away if you are not doing well or get worse.   This information is not intended to replace advice given to you by your health care provider. Make sure you discuss any questions you have with your health care provider.   Document Released: 02/13/2005 Document Revised: 06/30/2013 Document Reviewed: 02/05/2012 Elsevier Interactive Patient Education 2016 [place]. Acute Urinary Retention, Male Acute urinary retention is the temporary inability to urinate. This is a common problem in older men. As men age their prostates become larger and block the flow of urine from the bladder. This is usually a problem that has come on gradually.  HOME CARE INSTRUCTIONS If you are sent home with a Foley catheter and a drainage system, you will need to discuss the best  course of action with your health care provider. While the catheter is in, maintain a good intake of fluids. Keep the drainage bag emptied and lower than your catheter. This is so that contaminated urine will not flow back into your bladder, which could lead to a urinary tract infection. There are two main types of drainage bags. One is a large bag that usually is used at night. It has a good capacity that will allow you to sleep through the night without having to empty it. The second type is called a leg bag. It has a smaller capacity, so it needs to be emptied more frequently. However, the main advantage is that it can be attached by a leg strap and can go underneath your clothing, allowing you the freedom to move about or leave your home. Only take over-the-counter or prescription medicines for pain, discomfort, or fever as directed by your health care provider.  SEEK MEDICAL CARE IF:  You develop a low-grade fever.  You experience spasms or leakage of urine with the spasms. SEEK IMMEDIATE MEDICAL CARE IF:  14. You develop chills or fever. 15. Your catheter stops draining urine. 16. Your catheter falls out. 17. You start to develop increased bleeding that does not respond to rest and increased fluid intake. MAKE SURE YOU: 14. Understand these instructions. 15. Will watch your condition. 16. Will get help right away if you are not doing well or get worse.   This information is not intended to replace advice given to you by your health care provider. Make sure you discuss any questions you have with your health care provider.   Document Released: 05/22/2000 Document Revised: 06/30/2014 Document Reviewed: 07/25/2012 Elsevier Interactive Patient Education [employer].

## 2015-02-01 NOTE — ED Provider Notes (Signed)
CSN: [id number]     Arrival date & time 02/01/15  0358 History   First MD Initiated Contact with Patient 02/01/15 0403     Chief Complaint  Patient presents with  . Constipation     (Consider location/radiation/quality/duration/timing/severity/associated sxs/prior Treatment) HPI Comments: Patient presents to the emergency department for evaluation of constipation. Patient reports that he has not been able to have a bowel movement for approximately a week. He is feeling lower abdominal pain and cramping and rectal pain. He has not had any vomiting.  Patient is a 81 y.o. male presenting with constipation.  Constipation Associated symptoms: abdominal pain     Past Medical History  Diagnosis Date  . Glaucoma     BOTH EYES  . Partial blindness     RIGHT EYE  . Enlargement of the nose     OUTER NOSE VERY ENLARGED--STATES HE SOMETIMES MASHES BUMPS ON THE NOSE AND BLOOD WILL COME OUT OF BUMPS.  PT DOES NOT HAVE PCP--SAW A DERMATOLOGIST ONCE--BUT STOPPED THE "PILLS" HE WAS GIVEN-DIDN'T LIKE THE WAY THE MED MADE HIM FEEL  . Hypertension     takes Amlodipine daily  . Weakness     numbness and tingling  . Edema     in feet   . Alzheimer disease     1st stage  . Chronic back pain     stenosis  . Inguinal hernia     left  . Urinary frequency   . Urinary urgency   . Hemorrhoids   . Dry eyes   . Warts     multiple on right ear lobe   Past Surgical History  Procedure Laterality Date  . Cataract extraction Bilateral 03/2011  . Eye surgery      RETINAL SURGERY RT EYE FOR EYE HEMORRHAGE--THEN HAD CATARACT REMOVED RT EYE  . Tonsillectomy      AGE 5  . Hemicolectomy    . Esophagogastroduodenoscopy N/A 08/04/2013    Procedure: ESOPHAGOGASTRODUODENOSCOPY (EGD);  Surgeon: Wonda Horner, MD;  Location: [facility];  Service: Endoscopy;  Laterality: N/A;  . Hernia repair Right 2008  . Colonoscopy    . Lumbar laminectomy with coflex 2 level N/A 01/25/2015    Procedure: Lumbar Two-Three,  Lumbar Three-Four Laminectomy with coflex;  Surgeon: Kristeen Miss, MD;  Location: [facility];  Service: Neurosurgery;  Laterality: N/A;  L2-3 L3-4 Laminectomy with coflex   Family History  Problem Relation Age of Onset  . Stroke Mother    Social History  Substance Use Topics  . Smoking status: Never Smoker   . Smokeless tobacco: Never Used  . Alcohol Use: No    Review of Systems  Gastrointestinal: Positive for abdominal pain and constipation.  All other systems reviewed and are negative.     Allergies  Review of patient's allergies indicates no known allergies.  Home Medications   Prior to Admission medications   Medication Sig Start Date End Date Taking? Authorizing Provider  acetaminophen (TYLENOL) 500 MG tablet Take 500 mg by mouth every 6 (six) hours as needed for pain. PAIN   Yes Historical Provider, MD  amLODipine (NORVASC) 10 MG tablet Take 10 mg by mouth daily.   Yes Historical Provider, MD  HYDROcodone-acetaminophen (NORCO/VICODIN) 5-325 MG tablet Take 1 tablet by mouth every 6 (six) hours as needed for moderate pain. 01/29/15  Yes Kristeen Miss, MD  Menthol-Methyl Salicylate (MUSCLE RUB) 10-15 % CREA Apply 1 application topically as needed for muscle pain.   Yes Historical Provider, MD  QUEtiapine (SEROQUEL) 25 MG tablet Take 1 tablet (25 mg total) by mouth 2 (two) times daily as needed (Agitation). 01/29/15  Yes Kristeen Miss, MD   BP 140/83 mmHg  Pulse 81  Temp(Src) 98.4 F (36.9 C) (Oral)  Resp 18  SpO2 98% Physical Exam  Constitutional: He is oriented to person, place, and time. He appears well-developed and well-nourished. No distress.  HENT:  Head: Normocephalic and atraumatic.  Right Ear: Hearing normal.  Left Ear: Hearing normal.  Nose: Nose normal.  Mouth/Throat: Oropharynx is clear and moist and mucous membranes are normal.  Eyes: Conjunctivae and EOM are normal. Pupils are equal, round, and reactive to light.  Neck: Normal range of motion. Neck  supple.  Cardiovascular: Regular rhythm, S1 normal and S2 normal.  Exam reveals no gallop and no friction rub.   No murmur heard. Pulmonary/Chest: Effort normal and breath sounds normal. No respiratory distress. He exhibits no tenderness.  Abdominal: Soft. Normal appearance and bowel sounds are normal. There is no hepatosplenomegaly. There is no tenderness. There is no rebound, no guarding, no tenderness at McBurney's point and negative Murphy's sign. No hernia.  Genitourinary: Rectal exam shows external hemorrhoid. Rectal exam shows no mass, no tenderness and anal tone normal.  Musculoskeletal: Normal range of motion.  Neurological: He is alert and oriented to person, place, and time. He has normal strength. No cranial nerve deficit or sensory deficit. Coordination normal. GCS eye subscore is 4. GCS verbal subscore is 5. GCS motor subscore is 6.  Skin: Skin is warm, dry and intact. No rash noted. No cyanosis.  Lumbar surgical site is healing well without any erythema or drainage  Psychiatric: He has a normal mood and affect. His speech is normal and behavior is normal. Thought content normal.  Nursing note and vitals reviewed.   ED Course  Procedures (including critical care time) Labs Review Labs Reviewed  URINALYSIS, ROUTINE W REFLEX MICROSCOPIC (NOT AT [facility]) - Abnormal; Notable for the following:    Color, Urine AMBER (*)    Hgb urine dipstick SMALL (*)    All other components within normal limits  URINE MICROSCOPIC-ADD ON - Abnormal; Notable for the following:    Squamous Epithelial / LPF 0-5 (*)    Bacteria, UA RARE (*)    All other components within normal limits    Imaging Review Dg Abd Acute W/chest  02/01/2015  CLINICAL DATA:  Constipation for 1 week. Rectal pain. History of Alzheimer's disease, hemorrhoids. EXAM: DG ABDOMEN ACUTE W/ 1V CHEST COMPARISON:  Chest radiograph January 27, 2015 FINDINGS: Cardiac silhouette is upper limits of normal in size. Tortuous calcified aorta  para no pleural effusion focal consolidation. No pneumothorax. Moderate degenerative change of thoracic spine with broad dextroscoliosis. Mild osteopenia. Soft tissue planes are nonsuspicious. Bowel gas pattern is nondilated and nonobstructive. Moderate amount of retained large bowel stool with stool distended rectum. No intra-abdominal mass effect or pathologic calcifications. L4-5 interspinous prosthesis. Phleboliths project in the pelvis. Severe old L1 compression fracture. IMPRESSION: Borderline cardiomegaly, no acute pulmonary process. Stool distended rectum. Moderate amount of retained large bowel stool without bowel obstruction. Electronically Signed   By: Elon Alas M.D.   On: 02/01/2015 04:44   I have personally reviewed and evaluated these images and lab results as part of my medical decision-making.   EKG Interpretation None      MDM   Final diagnoses:  None   urinary retention constipation  Patient presents to the emergency department for evaluation of lower  abdominal and rectal pain. Patient initially reported that he was constipated and used a suppository prior to arrival. Wife is concerned that he might have inserted a suppository with the foil wrapper still in place. X-ray performed did not show any foreign bodies. Patient does have significant stool burden in colon. There was no rectal or fecal impaction encountered.  Patient continued to complain of pain and it was determined that he was experiencing bladder pain. He reports that he has only been able to "dribble" since she left the hospital. He had severe sensation of needing to pass his urine but could not urinate. Bladder scan showed greater than 1 L. A Foley catheter was placed and his bladder was decompressed. Patient immediately had resolution of his pain.  Careful examination reveals no saddle anesthesia. Patient has normal strength and sensation in both lower extremities. He had rectal tone that was normal on  rectal exam. I did discuss this briefly with Dr. Sherwood Gambler, on-call for neurosurgery. Specifically we discussed the etiology of the urinary retention. Dr. Sherwood Gambler did not feel that the patient required MRI or further evaluation for postoperative complications at the surgery site. This is most likely multifactorial, secondary to general anesthesia and pain medication. Patient can be discharged with Foley catheter, follow-up with urology.   Orpah Greek, MD 02/01/15 [phone]

## 2015-02-01 NOTE — ED Notes (Signed)
Per pt, he has not had a bowel movement x 1 week. Denies abdominal pain, c/o some rectal pain.

## 2015-02-01 NOTE — ED Notes (Signed)
Patient transported to X-ray 

## 2015-03-11 DIAGNOSIS — H35373 Puckering of macula, bilateral: Secondary | ICD-10-CM | POA: Diagnosis not present

## 2015-03-11 DIAGNOSIS — H353122 Nonexudative age-related macular degeneration, left eye, intermediate dry stage: Secondary | ICD-10-CM | POA: Diagnosis not present

## 2015-03-11 DIAGNOSIS — H34811 Central retinal vein occlusion, right eye, with macular edema: Secondary | ICD-10-CM | POA: Diagnosis not present

## 2015-03-11 DIAGNOSIS — H353112 Nonexudative age-related macular degeneration, right eye, intermediate dry stage: Secondary | ICD-10-CM | POA: Diagnosis not present

## 2015-03-15 DIAGNOSIS — K5900 Constipation, unspecified: Secondary | ICD-10-CM | POA: Diagnosis not present

## 2015-03-23 DIAGNOSIS — R413 Other amnesia: Secondary | ICD-10-CM | POA: Diagnosis not present

## 2015-03-23 DIAGNOSIS — M545 Low back pain: Secondary | ICD-10-CM | POA: Diagnosis not present

## 2015-03-23 DIAGNOSIS — F329 Major depressive disorder, single episode, unspecified: Secondary | ICD-10-CM | POA: Diagnosis not present

## 2015-03-23 DIAGNOSIS — K4090 Unilateral inguinal hernia, without obstruction or gangrene, not specified as recurrent: Secondary | ICD-10-CM | POA: Diagnosis not present

## 2015-03-23 DIAGNOSIS — I10 Essential (primary) hypertension: Secondary | ICD-10-CM | POA: Diagnosis not present

## 2015-04-01 ENCOUNTER — Ambulatory Visit: Payer: Self-pay | Admitting: Surgery

## 2015-04-01 DIAGNOSIS — M4806 Spinal stenosis, lumbar region: Secondary | ICD-10-CM | POA: Diagnosis not present

## 2015-04-01 DIAGNOSIS — L711 Rhinophyma: Secondary | ICD-10-CM | POA: Diagnosis not present

## 2015-04-01 DIAGNOSIS — K4090 Unilateral inguinal hernia, without obstruction or gangrene, not specified as recurrent: Secondary | ICD-10-CM | POA: Diagnosis not present

## 2015-04-01 NOTE — H&P (Signed)
Anthony Tyler 04/01/2015 10:23 AM Location: [facility] Patient #: [id number] DOB: December 29, 1930 Married / Language: English / Race: White Male   History of Present Illness Rodman Key B. Hassell Done MD; 04/01/2015 10:43 AM) The patient is a 82 year old male who presents with an inguinal hernia. The hernia(s) is/are located on the left side. This has gotten aggravated by straining at stool and it may in fact contain his sigmoid colon. The only think that works for him is castor oil.  I previously performed a RIH and didn right colectomy. I discussed inguinal herniorrhaphy with him. Since he had one before he is pretty well aware of what to expect. I think that we could do him over [place] long keep him overnight for observation because of his age. He is followed by Donnie Coffin.   Other Problems Davy Pique Bynum, CMA; 04/01/2015 10:23 AM) Back Pain Depression High blood pressure  Past Surgical History Marjean Donna, CMA; 04/01/2015 10:23 AM) Cataract Surgery Bilateral.  Diagnostic Studies History Marjean Donna, CMA; 04/01/2015 10:23 AM) Colonoscopy 1-5 years ago  Medication History Marjean Donna, CMA; 04/01/2015 10:24 AM) QUEtiapine Fumarate (25MG  Tablet, Oral) Active. Tamsulosin HCl (0.4MG  Capsule, Oral) Active. Generlac (10GM/15ML Solution, Oral) Active. Medications Reconciled  Social History Marjean Donna, CMA; 04/01/2015 10:23 AM) No alcohol use No caffeine use No drug use Tobacco use Never smoker.  Family History Marjean Donna, [place]; 04/01/2015 10:23 AM) Family history unknown First Degree Relatives    Review of Systems Davy Pique Bynum CMA; 04/01/2015 10:23 AM) General Present- Weight Loss. Not Present- Appetite Loss, Chills, Fatigue, Fever, Night Sweats and Weight Gain. Skin Not Present- Change in Wart/Mole, Dryness, Hives, Jaundice, New Lesions, Non-Healing Wounds, Rash and Ulcer. HEENT Present- Hoarseness, Visual Disturbances and Wears glasses/contact lenses. Not  Present- Earache, Hearing Loss, Nose Bleed, Oral Ulcers, Ringing in the Ears, Seasonal Allergies, Sinus Pain, Sore Throat and Yellow Eyes. Respiratory Not Present- Bloody sputum, Chronic Cough, Difficulty Breathing, Snoring and Wheezing. Cardiovascular Present- Leg Cramps and Swelling of Extremities. Not Present- Chest Pain, Difficulty Breathing Lying Down, Palpitations, Rapid Heart Rate and Shortness of Breath. Gastrointestinal Present- Abdominal Pain, Change in Bowel Habits, Constipation and Rectal Pain. Not Present- Bloating, Bloody Stool, Chronic diarrhea, Difficulty Swallowing, Excessive gas, Gets full quickly at meals, Hemorrhoids, Indigestion, Nausea and Vomiting. Male Genitourinary Not Present- Blood in Urine, Change in Urinary Stream, Frequency, Impotence, Nocturia, Painful Urination, Urgency and Urine Leakage. Musculoskeletal Present- Back Pain and Joint Pain. Not Present- Joint Stiffness, Muscle Pain, Muscle Weakness and Swelling of Extremities. Neurological Present- Decreased Memory and Trouble walking. Not Present- Fainting, Headaches, Numbness, Seizures, Tingling, Tremor and Weakness.  Vitals (Sonya Bynum CMA; 04/01/2015 10:24 AM) 04/01/2015 10:23 AM Weight: 183 lb Height: 71in Body Surface Area: 2.03 m Body Mass Index: 25.52 kg/m  Pulse: 79 (Regular)  BP: 130/70 (Sitting, Left Arm, Standard)       Physical Exam (Jakala Herford B. Hassell Done MD; 04/01/2015 10:45 AM) General Note: WDolder appearing man NAD with prominent rhynophyma Neck supple Chest clear Heart occasional ectopy noted. no murmurs abdomen large reducible left inguinal hernia ext FROM   ENMT Nose and Sinuses External Inspection of the Nose - asymmetric(rhynophyma prominent).    Assessment & Plan Rodman Key B. Hassell Done MD; 04/01/2015 10:48 AM) Leodis [place] (L71.1) LEFT INGUINAL HERNIA (K40.90) Impression: Will schedule open left inguinal hernia repair with overnight observation  Kaylyn Lim, MD, FACS

## 2015-04-19 NOTE — Patient Instructions (Addendum)
Anthony Tyler  04/19/2015   Your procedure is scheduled on: 05/04/2015    Report to [facility] Main  Entrance take Anthony Tyler  elevators to 3rd floor to  [name] at   [name].  Call this number if you have problems the morning of surgery [phone]   Remember: ONLY 1 PERSON MAY GO WITH YOU TO SHORT STAY TO GET  READY MORNING OF Anthony Tyler.   Do not eat food or drink liquids :After Midnight.     Take these medicines the morning of surgery with A SIP OF WATER: Amlodipine ( Norvasc), Namenda, Liquifilm tears if needed                                 You may not have any metal on your body including hair pins and              piercings  Do not wear jewelry, , lotions, powders or perfumes, deodorant                        Men may shave face and neck.   Do not bring valuables to the hospital. Anthony Tyler.  Contacts, dentures or bridgework may not be worn into surgery.  Leave suitcase in the car. After surgery it may be brought to your room.         Special Instructions:  Coughing and deep breathing exercises, leg exercises               Please read over the following fact sheets you were given: _____________________________________________________________________             [facility] - Preparing for Surgery Before surgery, you can play an important role.  Because skin is not sterile, your skin needs to be as free of germs as possible.  You can reduce the number of germs on your skin by washing with CHG (chlorahexidine gluconate) soap before surgery.  CHG is an antiseptic cleaner which kills germs and bonds with the skin to continue killing germs even after washing. Please DO NOT use if you have an allergy to CHG or antibacterial soaps.  If your skin becomes reddened/irritated stop using the CHG and inform your nurse when you arrive at Short Stay. Do not shave (including legs and underarms) for at  least 48 hours prior to the first CHG shower.  You may shave your face/neck. Please follow these instructions carefully:  1.  Shower with CHG Soap the night before surgery and the  morning of Surgery.  2.  If you choose to wash your hair, wash your hair first as usual with your  normal  shampoo.  3.  After you shampoo, rinse your hair and body thoroughly to remove the  shampoo.                           4.  Use CHG as you would any other liquid soap.  You can apply chg directly  to the skin and wash                       Gently with a  scrungie or clean washcloth.  5.  Apply the CHG Soap to your body ONLY FROM THE NECK DOWN.   Do not use on face/ open                           Wound or open sores. Avoid contact with eyes, ears mouth and genitals (private parts).                       Wash face,  Genitals (private parts) with your normal soap.             6.  Wash thoroughly, paying special attention to the area where your surgery  will be performed.  7.  Thoroughly rinse your body with warm water from the neck down.  8.  DO NOT shower/wash with your normal soap after using and rinsing off  the CHG Soap.                9.  Pat yourself dry with a clean towel.            10.  Wear clean pajamas.            11.  Place clean sheets on your bed the night of your first shower and do not  sleep with pets. Day of Surgery : Do not apply any lotions/deodorants the morning of surgery.  Please wear clean clothes to the hospital/surgery [facility].  FAILURE TO FOLLOW THESE INSTRUCTIONS MAY RESULT IN THE CANCELLATION OF YOUR SURGERY PATIENT SIGNATURE_________________________________  NURSE SIGNATURE__________________________________  ________________________________________________________________________

## 2015-04-20 ENCOUNTER — Encounter (HOSPITAL_COMMUNITY): Payer: Self-pay

## 2015-04-20 ENCOUNTER — Encounter (HOSPITAL_COMMUNITY)
Admission: RE | Admit: 2015-04-20 | Discharge: 2015-04-20 | Disposition: A | Discharge status: Home / Self Care | Payer: PPO | Source: Ambulatory Visit | Attending: Surgery | Admitting: Surgery

## 2015-04-20 DIAGNOSIS — I10 Essential (primary) hypertension: Secondary | ICD-10-CM | POA: Diagnosis not present

## 2015-04-20 DIAGNOSIS — Z01818 Encounter for other preprocedural examination: Secondary | ICD-10-CM | POA: Diagnosis not present

## 2015-04-20 DIAGNOSIS — K4090 Unilateral inguinal hernia, without obstruction or gangrene, not specified as recurrent: Secondary | ICD-10-CM | POA: Insufficient documentation

## 2015-04-20 DIAGNOSIS — I440 Atrioventricular block, first degree: Secondary | ICD-10-CM | POA: Insufficient documentation

## 2015-04-20 DIAGNOSIS — I493 Ventricular premature depolarization: Secondary | ICD-10-CM | POA: Insufficient documentation

## 2015-04-20 DIAGNOSIS — Z01812 Encounter for preprocedural laboratory examination: Secondary | ICD-10-CM | POA: Diagnosis not present

## 2015-04-20 LAB — BASIC METABOLIC PANEL
Anion gap: 5 (ref 5–15)
BUN: 20 mg/dL (ref 6–20)
CO2: 26 mmol/L (ref 22–32)
CREATININE: 1.16 mg/dL (ref 0.61–1.24)
Calcium: 9 mg/dL (ref 8.9–10.3)
Chloride: 106 mmol/L (ref 101–111)
GFR calc Af Amer: >60 mL/min (ref >60)
GFR calc non Af Amer: 56 mL/min — ABNORMAL LOW (ref >60)
GLUCOSE: 103 mg/dL — AB (ref 65–99)
POTASSIUM: 4.8 mmol/L (ref 3.5–5.1)
Sodium: 137 mmol/L (ref 135–145)

## 2015-04-20 LAB — CBC
HCT: 42 % (ref 39.0–52.0)
Hemoglobin: 13.7 g/dL (ref 13.0–17.0)
MCH: 32.2 pg (ref 26.0–34.0)
MCHC: 32.6 g/dL (ref 30.0–36.0)
MCV: 98.8 fL (ref 78.0–100.0)
PLATELETS: 165 10*3/uL (ref 150–400)
RBC: 4.25 MIL/uL (ref 4.22–5.81)
RDW: 13.8 % (ref 11.5–15.5)
WBC: 7.8 10*3/uL (ref 4.0–10.5)

## 2015-04-20 NOTE — Progress Notes (Signed)
Consulted Anesthesia , Dr. Marin Comment and Dr. Finis Bud about patient's EKG result from 01/28/2016 in which patient EKG reads  Atrial Fibrillation.Today 's EKG shows Sinus Rhythm with !st degree AV block with frequent PVC's. Dr. Finis Bud wants patient to go back to PCP-Dr. Donnie Coffin and follow up with him for clearance and get a Cardiac Clearance. Abigail Butts at [facility] notified of patient needing this for surgery. Patient's wife instructed to call PCP and make appointment foe follow-up for irregular heart rate.

## 2015-04-29 DIAGNOSIS — F329 Major depressive disorder, single episode, unspecified: Secondary | ICD-10-CM | POA: Insufficient documentation

## 2015-04-29 DIAGNOSIS — M545 Low back pain, unspecified: Secondary | ICD-10-CM | POA: Insufficient documentation

## 2015-04-29 DIAGNOSIS — R413 Other amnesia: Secondary | ICD-10-CM | POA: Insufficient documentation

## 2015-04-29 DIAGNOSIS — R198 Other specified symptoms and signs involving the digestive system and abdomen: Secondary | ICD-10-CM | POA: Insufficient documentation

## 2015-04-29 DIAGNOSIS — K5900 Constipation, unspecified: Secondary | ICD-10-CM | POA: Insufficient documentation

## 2015-04-29 DIAGNOSIS — K4090 Unilateral inguinal hernia, without obstruction or gangrene, not specified as recurrent: Secondary | ICD-10-CM | POA: Insufficient documentation

## 2015-04-29 DIAGNOSIS — I10 Essential (primary) hypertension: Secondary | ICD-10-CM | POA: Insufficient documentation

## 2015-04-29 DIAGNOSIS — K254 Chronic or unspecified gastric ulcer with hemorrhage: Secondary | ICD-10-CM | POA: Insufficient documentation

## 2015-05-04 ENCOUNTER — Ambulatory Visit (HOSPITAL_COMMUNITY): Admission: RE | Admit: 2015-05-04 | Payer: PPO | Source: Ambulatory Visit | Admitting: Surgery

## 2015-05-04 ENCOUNTER — Encounter (HOSPITAL_COMMUNITY): Admission: RE | Payer: Self-pay | Source: Ambulatory Visit

## 2015-05-04 ENCOUNTER — Ambulatory Visit (INDEPENDENT_AMBULATORY_CARE_PROVIDER_SITE_OTHER): Payer: PPO | Admitting: Interventional Cardiology

## 2015-05-04 ENCOUNTER — Encounter: Payer: Self-pay | Admitting: Interventional Cardiology

## 2015-05-04 VITALS — BP 120/65 | HR 65 | Ht 74.0 in | Wt 178.4 lb

## 2015-05-04 DIAGNOSIS — I10 Essential (primary) hypertension: Secondary | ICD-10-CM | POA: Diagnosis not present

## 2015-05-04 DIAGNOSIS — Z01810 Encounter for preprocedural cardiovascular examination: Secondary | ICD-10-CM | POA: Diagnosis not present

## 2015-05-04 DIAGNOSIS — I493 Ventricular premature depolarization: Secondary | ICD-10-CM | POA: Diagnosis not present

## 2015-05-04 SURGERY — REPAIR, HERNIA, INGUINAL, ADULT
Anesthesia: General | Laterality: Left

## 2015-05-04 NOTE — Progress Notes (Signed)
Patient ID: Anthony Tyler, male   DOB: 01-19-1931, 82 y.o.   MRN: [id number]     Cardiology Office Note   Date:  05/04/2015   ID:  Anthony Tyler, DOB 1930-05-07, MRN [id number]  PCP:  Donnie Coffin, MD    No chief complaint on file.    Wt Readings from Last 3 Encounters:  05/04/15 178 lb 6.4 oz (80.922 kg)  04/20/15 179 lb 3.2 oz (81.285 kg)  01/25/15 180 lb (81.647 kg)       History of Present Illness: Anthony Tyler is a 82 y.o. male  Who requires hernia surgery. He has no significant cardiac history. He was evaluated several months ago in the hospital when he had frequent PVCs around the time of back surgery. He tolerated back surgery without any cardiac complications. He had an echocardiogram showing no significant cardiac abnormalities. He denies any chest discomfort or shortness of breath. He does some walking but is somewhat limited by his back. He has no significant family history of heart disease. He smoked many years ago. Overall, he feels well from a cardiac standpoint.  His wife is a patient of mine as well. That is how he ended up seeing me today. He has had prior benign tumor removed from his abdomen in 2013. He tolerated this well. The hernia that he has is on the other side of his abdomen.   His blood pressures typically well controlled.    Past Medical History  Diagnosis Date  . Glaucoma     BOTH EYES  . Partial blindness     RIGHT EYE  . Enlargement of the nose     OUTER NOSE VERY ENLARGED--STATES HE SOMETIMES MASHES BUMPS ON THE NOSE AND BLOOD WILL COME OUT OF BUMPS.  PT DOES NOT HAVE PCP--SAW A DERMATOLOGIST ONCE--BUT STOPPED THE "PILLS" HE WAS GIVEN-DIDN'T LIKE THE WAY THE MED MADE HIM FEEL  . Hypertension     takes Amlodipine daily  . Weakness     numbness and tingling  . Edema     in feet   . Alzheimer disease     1st stage  . Chronic back pain     stenosis  . Inguinal hernia     left  . Urinary frequency   . Urinary urgency   .  Hemorrhoids   . Dry eyes   . Warts     multiple on right ear lobe    Past Surgical History  Procedure Laterality Date  . Cataract extraction Bilateral 03/2011  . Eye surgery      RETINAL SURGERY RT EYE FOR EYE HEMORRHAGE--THEN HAD CATARACT REMOVED RT EYE  . Tonsillectomy      AGE 66  . Hemicolectomy    . Esophagogastroduodenoscopy N/A 08/04/2013    Procedure: ESOPHAGOGASTRODUODENOSCOPY (EGD);  Surgeon: Wonda Horner, MD;  Location: [facility];  Service: Endoscopy;  Laterality: N/A;  . Hernia repair Right 2008  . Colonoscopy    . Lumbar laminectomy with coflex 2 level N/A 01/25/2015    Procedure: Lumbar Two-Three, Lumbar Three-Four Laminectomy with coflex;  Surgeon: Kristeen Miss, MD;  Location: [facility];  Service: Neurosurgery;  Laterality: N/A;  L2-3 L3-4 Laminectomy with coflex     Current Outpatient Prescriptions  Medication Sig Dispense Refill  . acetaminophen (TYLENOL) 500 MG tablet Take 500 mg by mouth every 6 (six) hours as needed for pain. PAIN    . amLODipine (NORVASC) 10 MG tablet Take 10 mg by mouth daily.    Marland Kitchen  lactulose (CHRONULAC) 10 GM/15ML solution Take 15 mLs (10 g total) by mouth 2 (two) times daily as needed for mild constipation or moderate constipation. 240 mL 0  . memantine (NAMENDA) 10 MG tablet Take 10 mg by mouth 2 (two) times daily.    . Menthol-Methyl Salicylate (MUSCLE RUB) 10-15 % CREA Apply 1 application topically as needed for muscle pain.    . polyvinyl alcohol (LIQUIFILM TEARS) 1.4 % ophthalmic solution Place 1 drop into both eyes 2 (two) times daily as needed for dry eyes.    Marland Kitchen QUEtiapine (SEROQUEL) 25 MG tablet Take 1 tablet (25 mg total) by mouth 2 (two) times daily as needed (Agitation). 30 tablet 3  . traMADol (ULTRAM) 50 MG tablet Take 50 mg by mouth every 6 (six) hours as needed (Pain).     No current facility-administered medications for this visit.    Allergies:   Donepezil    Social History:  The patient  reports that he has never  smoked. He has never used smokeless tobacco. He reports that he does not drink alcohol or use illicit drugs.   Family History:  The patient's family history includes Arrhythmia in his brother and sister; Atrial fibrillation in his brother and sister; Hypertension in his brother; Stroke in his mother. There is no history of Heart attack.    ROS:  Please see the history of present illness.   Otherwise, review of systems are positive for back pain, leg numbness.   All other systems are reviewed and negative.    PHYSICAL EXAM: VS:  BP 120/65 mmHg  Pulse 65  Ht 6\' 2"  (1.88 m)  Wt 178 lb 6.4 oz (80.922 kg)  BMI 22.90 kg/m2 , BMI Body mass index is 22.9 kg/(m^2). GEN: Well nourished, well developed, in no acute distress HEENT: tissue growth on tip of nose Neck: no JVD, carotid bruits, or masses Cardiac: RRR; no murmurs, rubs, or gallops,no edema  Respiratory:  clear to auscultation bilaterally, normal work of breathing GI: soft, nontender, nondistended, + BS MS: no deformity or atrophy Skin: warm and dry, no rash Neuro:  Strength and sensation are intact Psych: euthymic mood, full affect   EKG:   The ekg ordered at preop appointment demonstrates normal sinus rhythm with frequent PVCs, no significant ST segment changes   Recent Labs: 01/26/2015: Magnesium 1.7 04/20/2015: BUN 20; Creatinine, Ser 1.16; Hemoglobin 13.7; Platelets 165; Potassium 4.8; Sodium 137   Lipid Panel No results found for: CHOL, TRIG, HDL, CHOLHDL, VLDL, LDLCALC, LDLDIRECT   Other studies Reviewed: Additional studies/ records that were reviewed today with results demonstrating: echo from 11/16..   ASSESSMENT AND PLAN:  1. Preoperative cardiovascular evaluation: Normal LV function by echocardiogram in November 2016. ECG reveals PVCs. He is asymptomatic. He has had these in the past. He denies any chest discomfort when he walks. Given that he tolerated back surgery just a few months ago without any problems, and  had normal LV function, would not plan any further cardiac testing at this time before hernia surgery. Given his age, he is at moderate risk of cardiac complication, in the 3% range. This is not a modifiable risk.  2. Abnormal ECG: Findings as noted above. He has frequent PVCs. This has not affected his LV function. He is asymptomatic. 3. Hypertension: Continue amlodipine. Blood pressure well controlled. 4. No significant family history of cardiac disease either.   Current medicines are reviewed at length with the patient today.  The patient concerns regarding his medicines were  addressed.  The following changes have been made:  No change  Labs/ tests ordered today include:  No orders of the defined types were placed in this encounter.    Recommend 150 minutes/week of aerobic exercise Low fat, low carb, high fiber diet recommended  Disposition:   FU prn   Teresita Madura., MD  05/04/2015 4:18 PM    [facility] [place] [address], [place], [place]  [address] Phone: [phone]; Fax: [phone]

## 2015-05-04 NOTE — Patient Instructions (Signed)
**Note De-Identified  Obfuscation** Medication Instructions:  Same-no changes  Labwork: None  Testing/Procedures: None  Follow-Up: Your physician recommends that you schedule a follow-up appointment in: as needed      If you need a refill on your cardiac medications before your next appointment, please call your pharmacy.

## 2015-05-31 ENCOUNTER — Ambulatory Visit: Payer: Self-pay | Admitting: Surgery

## 2015-06-14 NOTE — Patient Instructions (Addendum)
Anthony Tyler  06/14/2015   Your procedure is scheduled on:  06-18-15  Report to [facility] Main  Entrance take [facility]  elevators to 3rd floor to  [place] at 1:30 PM  Call this number if you have problems the morning of surgery [phone]   Remember: ONLY 1 PERSON MAY GO WITH YOU TO SHORT STAY TO GET  READY MORNING OF YOUR SURGERY.  Do not eat food after midnight Thursday night.  Follow clear liquid diet until 8:30 AM Friday morning.  Then nothing by mouth.     Take these medicines the morning of surgery with A SIP OF WATER:  Amlodipine (norvasc), Memantine (namenda) DO NOT TAKE ANY DIABETIC MEDICATIONS DAY OF YOUR SURGERY                               You may not have any metal on your body including hair pins and              piercings  Do not wear jewelry lotions, powders or perfumes, deodorant                        Men may shave face and neck.   Do not bring valuables to the hospital. [place].  Contacts, dentures or bridgework may not be worn into surgery.  Leave suitcase in the car. After surgery it may be brought to your room.    Marland Kitchen   Special Instructions: coughing and deep breathing exercises, leg exercises              Please read over the following fact sheets you were given: _____________________________________________________________________             [facility] - Preparing for Surgery Before surgery, you can play an important role.  Because skin is not sterile, your skin needs to be as free of germs as possible.  You can reduce the number of germs on your skin by washing with CHG (chlorahexidine gluconate) soap before surgery.  CHG is an antiseptic cleaner which kills germs and bonds with the skin to continue killing germs even after washing. Please DO NOT use if you have an allergy to CHG or antibacterial soaps.  If your skin becomes reddened/irritated stop using the CHG and  inform your nurse when you arrive at Short Stay. Do not shave (including legs and underarms) for at least 48 hours prior to the first CHG shower.  You may shave your face/neck. Please follow these instructions carefully:  1.  Shower with CHG Soap the night before surgery and the  morning of Surgery.  2.  If you choose to wash your hair, wash your hair first as usual with your  normal  shampoo.  3.  After you shampoo, rinse your hair and body thoroughly to remove the  shampoo.                           4.  Use CHG as you would any other liquid soap.  You can apply chg directly  to the skin and wash  Gently with a scrungie or clean washcloth.  5.  Apply the CHG Soap to your body ONLY FROM THE NECK DOWN.   Do not use on face/ open                           Wound or open sores. Avoid contact with eyes, ears mouth and genitals (private parts).                       Wash face,  Genitals (private parts) with your normal soap.             6.  Wash thoroughly, paying special attention to the area where your surgery  will be performed.  7.  Thoroughly rinse your body with warm water from the neck down.  8.  DO NOT shower/wash with your normal soap after using and rinsing off  the CHG Soap.                9.  Pat yourself dry with a clean towel.            10.  Wear clean pajamas.            11.  Place clean sheets on your bed the night of your first shower and do not  sleep with pets. Day of Surgery : Do not apply any lotions/deodorants the morning of surgery.  Please wear clean clothes to the hospital/surgery center.  FAILURE TO FOLLOW THESE INSTRUCTIONS MAY RESULT IN THE CANCELLATION OF YOUR SURGERY PATIENT SIGNATURE_________________________________  NURSE SIGNATURE__________________________________  ________________________________________________________________________    CLEAR LIQUID DIET   Foods Allowed                                                                      Foods Excluded  Coffee and tea, regular and decaf                             liquids that you cannot  Plain Jell-O in any flavor                                             see through such as: Fruit ices (not with fruit pulp)                                     milk, soups, orange juice  Iced Popsicles                                    All solid food Carbonated beverages, regular and diet                                    Cranberry, grape and apple juices Sports drinks like Gatorade Lightly seasoned clear broth or consume(fat free) Sugar, honey syrup  Sample Menu Breakfast                                Lunch                                     Supper Cranberry juice                    Beef broth                            Chicken broth Jell-O                                     Grape juice                           Apple juice Coffee or tea                        Jell-O                                      Popsicle                                                Coffee or tea                        Coffee or tea  _____________________________________________________________________

## 2015-06-15 ENCOUNTER — Encounter (HOSPITAL_COMMUNITY)
Admission: RE | Admit: 2015-06-15 | Discharge: 2015-06-15 | Disposition: A | Discharge status: Home / Self Care | Payer: PPO | Source: Ambulatory Visit | Attending: Surgery | Admitting: Surgery

## 2015-06-15 ENCOUNTER — Encounter (HOSPITAL_COMMUNITY): Payer: Self-pay

## 2015-06-15 ENCOUNTER — Other Ambulatory Visit (HOSPITAL_COMMUNITY): Payer: PPO

## 2015-06-15 DIAGNOSIS — Z79899 Other long term (current) drug therapy: Secondary | ICD-10-CM | POA: Diagnosis not present

## 2015-06-15 DIAGNOSIS — K4090 Unilateral inguinal hernia, without obstruction or gangrene, not specified as recurrent: Secondary | ICD-10-CM | POA: Diagnosis not present

## 2015-06-15 DIAGNOSIS — I10 Essential (primary) hypertension: Secondary | ICD-10-CM | POA: Diagnosis not present

## 2015-06-15 DIAGNOSIS — L711 Rhinophyma: Secondary | ICD-10-CM | POA: Diagnosis not present

## 2015-06-15 LAB — BASIC METABOLIC PANEL
Anion gap: 6 (ref 5–15)
BUN: 18 mg/dL (ref 6–20)
CHLORIDE: 112 mmol/L — AB (ref 101–111)
CO2: 25 mmol/L (ref 22–32)
CREATININE: 1.54 mg/dL — AB (ref 0.61–1.24)
Calcium: 9.1 mg/dL (ref 8.9–10.3)
GFR calc non Af Amer: 40 mL/min — ABNORMAL LOW (ref >60)
GFR, EST AFRICAN AMERICAN: 46 mL/min — AB (ref >60)
Glucose, Bld: 105 mg/dL — ABNORMAL HIGH (ref 65–99)
Potassium: 4.2 mmol/L (ref 3.5–5.1)
Sodium: 143 mmol/L (ref 135–145)

## 2015-06-15 LAB — CBC
HEMATOCRIT: 39.4 % (ref 39.0–52.0)
Hemoglobin: 13.9 g/dL (ref 13.0–17.0)
MCH: 32.4 pg (ref 26.0–34.0)
MCHC: 35.3 g/dL (ref 30.0–36.0)
MCV: 91.8 fL (ref 78.0–100.0)
PLATELETS: 131 10*3/uL — AB (ref 150–400)
RBC: 4.29 MIL/uL (ref 4.22–5.81)
RDW: 13.6 % (ref 11.5–15.5)
WBC: 8.4 10*3/uL (ref 4.0–10.5)

## 2015-06-15 NOTE — Progress Notes (Addendum)
05-04-15 - LOV - Dr. Irish Lack (cardio) & cardiac clearance - EPIC 04-20-15 - EKG - EPIC 01-27-15 - Echo & 1V CXR - EPIC

## 2015-06-15 NOTE — Progress Notes (Signed)
06-15-15 - BMP lab results from preop appt. On 06-15-15 faxed to Dr. Hassell Done via [facility]

## 2015-06-15 NOTE — Progress Notes (Signed)
06-15-15 - CBC lab results from preop appt. On 06-15-15 faxed to Dr. Hassell Done via [facility]

## 2015-06-18 ENCOUNTER — Encounter (HOSPITAL_COMMUNITY): Payer: Self-pay | Admitting: Anesthesiology

## 2015-06-18 ENCOUNTER — Ambulatory Visit (HOSPITAL_COMMUNITY): Payer: PPO | Admitting: Anesthesiology

## 2015-06-18 ENCOUNTER — Observation Stay (HOSPITAL_COMMUNITY)
Admission: RE | Admit: 2015-06-18 | Discharge: 2015-06-19 | Disposition: A | Discharge status: Home / Self Care | Payer: PPO | Source: Ambulatory Visit | Attending: Surgery | Admitting: Surgery

## 2015-06-18 ENCOUNTER — Encounter (HOSPITAL_COMMUNITY)
Admission: RE | Disposition: A | Discharge status: Home / Self Care | Payer: Self-pay | Source: Ambulatory Visit | Attending: Surgery

## 2015-06-18 DIAGNOSIS — L711 Rhinophyma: Secondary | ICD-10-CM | POA: Diagnosis not present

## 2015-06-18 DIAGNOSIS — Z79899 Other long term (current) drug therapy: Secondary | ICD-10-CM | POA: Insufficient documentation

## 2015-06-18 DIAGNOSIS — Z9889 Other specified postprocedural states: Secondary | ICD-10-CM

## 2015-06-18 DIAGNOSIS — Z8719 Personal history of other diseases of the digestive system: Secondary | ICD-10-CM

## 2015-06-18 DIAGNOSIS — I10 Essential (primary) hypertension: Secondary | ICD-10-CM | POA: Insufficient documentation

## 2015-06-18 DIAGNOSIS — M549 Dorsalgia, unspecified: Secondary | ICD-10-CM | POA: Diagnosis not present

## 2015-06-18 DIAGNOSIS — K4090 Unilateral inguinal hernia, without obstruction or gangrene, not specified as recurrent: Principal | ICD-10-CM | POA: Insufficient documentation

## 2015-06-18 HISTORY — PX: INGUINAL HERNIA REPAIR: SHX194

## 2015-06-18 LAB — CREATININE, SERUM
Creatinine, Ser: 1.31 mg/dL — ABNORMAL HIGH (ref 0.61–1.24)
GFR calc non Af Amer: 49 mL/min — ABNORMAL LOW (ref >60)
GFR, EST AFRICAN AMERICAN: 56 mL/min — AB (ref >60)

## 2015-06-18 LAB — CBC
HCT: 38.5 % — ABNORMAL LOW (ref 39.0–52.0)
Hemoglobin: 13.3 g/dL (ref 13.0–17.0)
MCH: 32.4 pg (ref 26.0–34.0)
MCHC: 34.5 g/dL (ref 30.0–36.0)
MCV: 93.9 fL (ref 78.0–100.0)
PLATELETS: 146 10*3/uL — AB (ref 150–400)
RBC: 4.1 MIL/uL — AB (ref 4.22–5.81)
RDW: 13.7 % (ref 11.5–15.5)
WBC: 13.7 10*3/uL — AB (ref 4.0–10.5)

## 2015-06-18 SURGERY — REPAIR, HERNIA, INGUINAL, ADULT
Anesthesia: General | Site: Groin | Laterality: Left

## 2015-06-18 MED ORDER — FENTANYL CITRATE (PF) 250 MCG/5ML IJ SOLN
INTRAMUSCULAR | Status: AC
  Filled 2015-06-18: qty 5

## 2015-06-18 MED ORDER — CEFAZOLIN SODIUM-DEXTROSE 2-4 GM/100ML-% IV SOLN
2.0000 g | INTRAVENOUS | Status: AC
Start: 2015-06-19 — End: 2015-06-18
  Administered 2015-06-18: 2 g via INTRAVENOUS
  Filled 2015-06-18: qty 100

## 2015-06-18 MED ORDER — EPHEDRINE SULFATE 50 MG/ML IJ SOLN
INTRAMUSCULAR | Status: DC | PRN
  Administered 2015-06-18: 10 mg via INTRAVENOUS

## 2015-06-18 MED ORDER — HYDROMORPHONE HCL 1 MG/ML IJ SOLN
INTRAMUSCULAR | Status: AC
  Filled 2015-06-18: qty 1

## 2015-06-18 MED ORDER — KETOROLAC TROMETHAMINE 15 MG/ML IJ SOLN
INTRAMUSCULAR | Status: AC
  Filled 2015-06-18: qty 1

## 2015-06-18 MED ORDER — POLYVINYL ALCOHOL 1.4 % OP SOLN
1.0000 [drp] | Freq: Two times a day (BID) | OPHTHALMIC | Status: DC | PRN
  Filled 2015-06-18: qty 15

## 2015-06-18 MED ORDER — QUETIAPINE FUMARATE 25 MG PO TABS
25.0000 mg | ORAL_TABLET | Freq: Two times a day (BID) | ORAL | Status: DC | PRN
  Filled 2015-06-18: qty 1

## 2015-06-18 MED ORDER — ONDANSETRON 4 MG PO TBDP: 4.0000 mg | ORAL_TABLET | Freq: Four times a day (QID) | ORAL | Status: DC | PRN

## 2015-06-18 MED ORDER — KETOROLAC TROMETHAMINE 30 MG/ML IJ SOLN
15.0000 mg | Freq: Once | INTRAMUSCULAR | Status: AC | PRN
  Administered 2015-06-18: 15 mg via INTRAVENOUS

## 2015-06-18 MED ORDER — HEPARIN SODIUM (PORCINE) 5000 UNIT/ML IJ SOLN
5000.0000 [IU] | Freq: Three times a day (TID) | INTRAMUSCULAR | Status: DC
  Filled 2015-06-18 (×4): qty 1

## 2015-06-18 MED ORDER — PROPOFOL 10 MG/ML IV BOLUS
INTRAVENOUS | Status: DC | PRN
  Administered 2015-06-18: 130 mg via INTRAVENOUS

## 2015-06-18 MED ORDER — PROPOFOL 10 MG/ML IV BOLUS
INTRAVENOUS | Status: AC
  Filled 2015-06-18: qty 20

## 2015-06-18 MED ORDER — BUPIVACAINE-EPINEPHRINE (PF) 0.5% -1:200000 IJ SOLN
INTRAMUSCULAR | Status: DC | PRN
  Administered 2015-06-18: 25 mL via PERINEURAL

## 2015-06-18 MED ORDER — BUPIVACAINE-EPINEPHRINE (PF) 0.5% -1:200000 IJ SOLN
INTRAMUSCULAR | Status: AC
  Filled 2015-06-18: qty 30

## 2015-06-18 MED ORDER — KCL IN DEXTROSE-NACL 20-5-0.45 MEQ/L-%-% IV SOLN
INTRAVENOUS | Status: DC
  Administered 2015-06-18: 21:00:00 via INTRAVENOUS
  Filled 2015-06-18: qty 1000

## 2015-06-18 MED ORDER — FENTANYL CITRATE (PF) 100 MCG/2ML IJ SOLN
INTRAMUSCULAR | Status: DC | PRN
  Administered 2015-06-18 (×2): 50 ug via INTRAVENOUS
  Administered 2015-06-18 (×2): 25 ug via INTRAVENOUS

## 2015-06-18 MED ORDER — HEPARIN SODIUM (PORCINE) 5000 UNIT/ML IJ SOLN
5000.0000 [IU] | Freq: Once | INTRAMUSCULAR | Status: AC
  Administered 2015-06-18: 5000 [IU] via SUBCUTANEOUS
  Filled 2015-06-18: qty 1

## 2015-06-18 MED ORDER — 0.9 % SODIUM CHLORIDE (POUR BTL) OPTIME
TOPICAL | Status: DC | PRN
  Administered 2015-06-18: 1000 mL

## 2015-06-18 MED ORDER — CEFAZOLIN SODIUM-DEXTROSE 2-4 GM/100ML-% IV SOLN
INTRAVENOUS | Status: AC
  Filled 2015-06-18: qty 100

## 2015-06-18 MED ORDER — MORPHINE SULFATE (PF) 10 MG/ML IV SOLN: 1.0000 mg | INTRAVENOUS | Status: DC | PRN

## 2015-06-18 MED ORDER — MIDAZOLAM HCL 2 MG/2ML IJ SOLN
INTRAMUSCULAR | Status: AC
  Filled 2015-06-18: qty 2

## 2015-06-18 MED ORDER — ONDANSETRON HCL 4 MG/2ML IJ SOLN
INTRAMUSCULAR | Status: AC
  Filled 2015-06-18: qty 2

## 2015-06-18 MED ORDER — BUPIVACAINE LIPOSOME 1.3 % IJ SUSP
20.0000 mL | Freq: Once | INTRAMUSCULAR | Status: DC
  Filled 2015-06-18: qty 20

## 2015-06-18 MED ORDER — HYDROCODONE-ACETAMINOPHEN 5-325 MG PO TABS
1.0000 | ORAL_TABLET | ORAL | Status: DC | PRN
  Administered 2015-06-18: 2 via ORAL
  Filled 2015-06-18: qty 2

## 2015-06-18 MED ORDER — SODIUM CHLORIDE 0.9 % IJ SOLN
INTRAMUSCULAR | Status: AC
  Filled 2015-06-18: qty 10

## 2015-06-18 MED ORDER — HYDROMORPHONE HCL 1 MG/ML IJ SOLN
0.2500 mg | INTRAMUSCULAR | Status: DC | PRN
  Administered 2015-06-18 (×4): 0.5 mg via INTRAVENOUS

## 2015-06-18 MED ORDER — EPHEDRINE SULFATE 50 MG/ML IJ SOLN
INTRAMUSCULAR | Status: AC
  Filled 2015-06-18: qty 1

## 2015-06-18 MED ORDER — CHLORHEXIDINE GLUCONATE 4 % EX LIQD: 1.0000 "application " | Freq: Once | CUTANEOUS | Status: DC

## 2015-06-18 MED ORDER — ONDANSETRON HCL 4 MG/2ML IJ SOLN: 4.0000 mg | Freq: Four times a day (QID) | INTRAMUSCULAR | Status: DC | PRN

## 2015-06-18 MED ORDER — AMLODIPINE BESYLATE 10 MG PO TABS
10.0000 mg | ORAL_TABLET | Freq: Every morning | ORAL | Status: DC
  Administered 2015-06-19: 10 mg via ORAL
  Filled 2015-06-18: qty 1

## 2015-06-18 MED ORDER — FENTANYL CITRATE (PF) 100 MCG/2ML IJ SOLN
INTRAMUSCULAR | Status: AC
  Filled 2015-06-18: qty 2

## 2015-06-18 MED ORDER — SODIUM CHLORIDE 0.9 % IJ SOLN
INTRAMUSCULAR | Status: DC | PRN
  Administered 2015-06-18: 10 mL

## 2015-06-18 MED ORDER — LACTATED RINGERS IV SOLN
INTRAVENOUS | Status: DC | PRN
  Administered 2015-06-18 (×2): via INTRAVENOUS

## 2015-06-18 MED ORDER — MEMANTINE HCL 10 MG PO TABS
10.0000 mg | ORAL_TABLET | Freq: Two times a day (BID) | ORAL | Status: DC
  Administered 2015-06-18 – 2015-06-19 (×2): 10 mg via ORAL
  Filled 2015-06-18 (×3): qty 1

## 2015-06-18 MED ORDER — ONDANSETRON HCL 4 MG/2ML IJ SOLN
INTRAMUSCULAR | Status: DC | PRN
  Administered 2015-06-18: 4 mg via INTRAVENOUS

## 2015-06-18 MED ORDER — LIDOCAINE HCL (CARDIAC) 20 MG/ML IV SOLN
INTRAVENOUS | Status: DC | PRN
  Administered 2015-06-18: 50 mg via INTRAVENOUS

## 2015-06-18 SURGICAL SUPPLY — 31 items
BLADE SURG 15 STRL LF DISP TIS (BLADE) ×1 IMPLANT
BLADE SURG 15 STRL SS (BLADE) ×3
COVER SURGICAL LIGHT HANDLE (MISCELLANEOUS) ×3 IMPLANT
DECANTER SPIKE VIAL GLASS SM (MISCELLANEOUS) ×3 IMPLANT
DISSECTOR ROUND CHERRY 3/8 STR (MISCELLANEOUS) IMPLANT
DRAIN PENROSE 18X1/2 LTX STRL (DRAIN) ×3 IMPLANT
DRAPE LAPAROTOMY TRNSV 102X78 (DRAPE) ×3 IMPLANT
ELECT PENCIL ROCKER SW 15FT (MISCELLANEOUS) ×3 IMPLANT
ELECT REM PT RETURN 9FT ADLT (ELECTROSURGICAL) ×3
ELECTRODE REM PT RTRN 9FT ADLT (ELECTROSURGICAL) ×1 IMPLANT
GLOVE BIOGEL M 8.0 STRL (GLOVE) ×3 IMPLANT
GOWN STRL REUS W/TWL XL LVL3 (GOWN DISPOSABLE) ×6 IMPLANT
KIT BASIN OR (CUSTOM PROCEDURE TRAY) ×3 IMPLANT
LIQUID BAND (GAUZE/BANDAGES/DRESSINGS) ×2 IMPLANT
MESH HERNIA 3X6 (Mesh General) ×2 IMPLANT
NEEDLE HYPO 22GX1.5 SAFETY (NEEDLE) ×3 IMPLANT
PACK BASIC VI WITH GOWN DISP (CUSTOM PROCEDURE TRAY) ×3 IMPLANT
SPONGE LAP 4X18 X RAY DECT (DISPOSABLE) ×3 IMPLANT
STAPLER VISISTAT 35W (STAPLE) IMPLANT
SUT MON AB 5-0 PS2 18 (SUTURE) ×2 IMPLANT
SUT PROLENE 2 0 CT2 30 (SUTURE) ×10 IMPLANT
SUT SILK 2 0 SH (SUTURE) IMPLANT
SUT VIC AB 2-0 SH 27 (SUTURE) ×6
SUT VIC AB 2-0 SH 27X BRD (SUTURE) ×1 IMPLANT
SUT VIC AB 4-0 SH 18 (SUTURE) ×3 IMPLANT
SUT VICRYL 4-0 (SUTURE) ×3 IMPLANT
SYR 20CC LL (SYRINGE) ×3 IMPLANT
SYR BULB IRRIGATION 50ML (SYRINGE) ×3 IMPLANT
TOWEL OR 17X26 10 PK STRL BLUE (TOWEL DISPOSABLE) ×3 IMPLANT
TOWEL OR NON WOVEN STRL DISP B (DISPOSABLE) ×3 IMPLANT
YANKAUER SUCT BULB TIP 10FT TU (MISCELLANEOUS) ×3 IMPLANT

## 2015-06-18 NOTE — Progress Notes (Signed)
AssistedDr. Delma Post with left, ultrasound guided, transabdominal plane block. Side rails up, monitors on throughout procedure. See vital signs in flow sheet. Tolerated Procedure well.

## 2015-06-18 NOTE — Brief Op Note (Signed)
06/18/2015  6:17 PM  PATIENT:  Anthony Tyler  82 y.o. male  PRE-OPERATIVE DIAGNOSIS:  symptomatic hernia  POST-OPERATIVE DIAGNOSIS:  symptomatic hernia  PROCEDURE:  Procedure(s): OPEN LEFT INGUINAL  HERNIA  (Left)  SURGEON:  Surgeon(s) and Role:    * Johnathan Hausen, MD - Primary  PHYSICIAN ASSISTANT:   ASSISTANTS: none   ANESTHESIA:   general  EBL:  Total I/O In: 1000 [I.V.:1000] Out: 50 [Blood:50]  BLOOD ADMINISTERED:none  DRAINS: none   LOCAL MEDICATIONS USED:  OTHER TAP block  SPECIMEN:  No Specimen  DISPOSITION OF SPECIMEN:  N/A  COUNTS:  YES  TOURNIQUET:  * No tourniquets in log *  DICTATION: .Other Dictation: Dictation Number [id number]  PLAN OF CARE: Admit for overnight observation  PATIENT DISPOSITION:  PACU - hemodynamically stable.   Delay start of Pharmacological VTE agent (>24hrs) due to surgical blood loss or risk of bleeding: yes

## 2015-06-18 NOTE — Op Note (Signed)
NAME:  Anthony Tyler, Anthony Tyler NO.:  [id number]  MEDICAL RECORD NO.:  [id number]  LOCATION:  67                         FACILITY:  [facility]  PHYSICIAN:  Isabel Caprice. Hassell Done, MD  DATE OF BIRTH:  [birth year]  DATE OF PROCEDURE:  06/18/2015 DATE OF DISCHARGE:                              OPERATIVE REPORT   PREOPERATIVE DIAGNOSIS:  Symptomatic left inguinal hernia.  POSTOPERATIVE DIAGNOSIS:  Left sliding inguinal hernia with large dilated ring and hernia containing sigmoid colon.  PROCEDURE:  Open left inguinal herniorrhaphy with closure of the ring with approximation of the ring and reduction in size with placement of 3 x 6 cotton mesh, Marlex type to repair the floor.  SURGEON:  Isabel Caprice. Hassell Done, MD  ANESTHESIA:  General endotracheal.  DESCRIPTION OF PROCEDURE:  [name] was taken to room #4 and given general anesthesia.  The abdomen was prepped with chlorhexidine and draped sterilely.  A left oblique incision was made and went down and found a large hernia.  His anatomy was somewhat distorted by this.  I opened as external oblique, mobilized this cord and had difficulty getting into the sac.  I did not get into any viscus, but found a little small sac that I was able to open and get into the larger sac, which was part of the hernia, which contained about two-thirds of the sac was the sigmoid colon.  Dissected it free from the cord structures and closed it with 2-0 Vicryl and reduced it up inside the abdomen.  I then closed the ring with some interrupted 2-0 Prolene.  I then cut a piece of Prolene mesh to fit and sutured along the inguinal ligament with a running 2-0 Prolene and medially to the internal oblique with 2-0 Prolene.  It was brought around the cord and sutured to itself with horizontal mattress with 2-0 Prolene.  The external oblique was covered over this laterally. Wound was irrigated, closed with 4-0 Vicryl and with a running subcuticular 5-0 Monocryl  and LiquiBand.  The patient seemed to tolerate the procedure well and was taken to the recovery room in satisfactory condition.     Isabel Caprice Hassell Done, MD     MBM/MEDQ  D:  06/18/2015  T:  06/18/2015  Job:  [id number]

## 2015-06-18 NOTE — Anesthesia Postprocedure Evaluation (Signed)
Anesthesia Post Note  Patient: Anthony Tyler  Procedure(s) Performed: Procedure(s) (LRB): OPEN LEFT INGUINAL  HERNIA  (Left)  Patient location during evaluation: PACU Anesthesia Type: General Level of consciousness: awake and alert Pain management: pain level controlled Vital Signs Assessment: post-procedure vital signs reviewed and stable Respiratory status: spontaneous breathing, nonlabored ventilation, respiratory function stable and patient connected to nasal cannula oxygen Cardiovascular status: blood pressure returned to baseline and stable Postop Assessment: no signs of nausea or vomiting Anesthetic complications: no    Last Vitals:  Filed Vitals:   06/18/15 1928 06/18/15 1930  BP: 139/90   Pulse: 84 89  Temp:    Resp: 11     Last Pain:  Filed Vitals:   06/18/15 1931  PainSc: 8                  Lamario Mani S

## 2015-06-18 NOTE — Anesthesia Procedure Notes (Signed)
Procedure Name: LMA Insertion Date/Time: 06/18/2015 4:12 PM Performed by: Anne Fu Pre-anesthesia Checklist: Patient identified, Emergency Drugs available, Suction available, Patient being monitored and Timeout performed Patient Re-evaluated:Patient Re-evaluated prior to inductionOxygen Delivery Method: Circle system utilized Preoxygenation: Pre-oxygenation with 100% oxygen Intubation Type: IV induction Ventilation: Mask ventilation without difficulty LMA: LMA inserted LMA Size: 5.0 Number of attempts: 1 Placement Confirmation: positive ETCO2 and breath sounds checked- equal and bilateral Tube secured with: Tape

## 2015-06-18 NOTE — Interval H&P Note (Signed)
History and Physical Interval Note:  06/18/2015 3:42 PM  Anthony Tyler  has presented today for surgery, with the diagnosis of symptomatic hernia  The various methods of treatment have been discussed with the patient and family. After consideration of risks, benefits and other options for treatment, the patient has consented to  Procedure(s): OPEN LEFT INGUINAL  HERNIA  (Left) as a surgical intervention .  The patient's history has been reviewed, patient examined, no change in status, stable for surgery.  I have reviewed the patient's chart and labs.  Questions were answered to the patient's satisfaction.     Leston Schueller B

## 2015-06-18 NOTE — Transfer of Care (Signed)
Immediate Anesthesia Transfer of Care Note  Patient: Anthony Tyler  Procedure(s) Performed: Procedure(s): OPEN LEFT INGUINAL  HERNIA  (Left)  Patient Location: PACU  Anesthesia Type:General  Level of Consciousness:  sedated, patient cooperative and responds to stimulation  Airway & Oxygen Therapy:Patient Spontanous Breathing and Patient connected to face mask oxgen  Post-op Assessment:  Report given to PACU RN and Post -op Vital signs reviewed and stable  Post vital signs:  Reviewed and stable  Last Vitals:  Filed Vitals:   06/18/15 1550 06/18/15 1820  BP:  135/87  Pulse:  76  Resp: 15     Complications: No apparent anesthesia complications

## 2015-06-18 NOTE — Anesthesia Preprocedure Evaluation (Signed)
Anesthesia Evaluation  Patient identified by MRN, date of birth, ID band Patient awake    Reviewed: Allergy & Precautions, NPO status , Patient's Chart, lab work & pertinent test results  Airway Mallampati: II  TM Distance: >3 FB Neck ROM: Full    Dental no notable dental hx.    Pulmonary neg pulmonary ROS,    Pulmonary exam normal breath sounds clear to auscultation       Cardiovascular hypertension, Normal cardiovascular exam Rhythm:Regular Rate:Normal     Neuro/Psych negative neurological ROS  negative psych ROS   GI/Hepatic negative GI ROS, Neg liver ROS,   Endo/Other  negative endocrine ROS  Renal/GU negative Renal ROS  negative genitourinary   Musculoskeletal negative musculoskeletal ROS (+)   Abdominal   Peds negative pediatric ROS (+)  Hematology negative hematology ROS (+)   Anesthesia Other Findings   Reproductive/Obstetrics negative OB ROS                             Anesthesia Physical Anesthesia Plan  ASA: III  Anesthesia Plan: General   Post-op Pain Management:    Induction: Intravenous  Airway Management Planned: Oral ETT and LMA  Additional Equipment:   Intra-op Plan:   Post-operative Plan: Extubation in OR  Informed Consent: I have reviewed the patients History and Physical, chart, labs and discussed the procedure including the risks, benefits and alternatives for the proposed anesthesia with the patient or authorized representative who has indicated his/her understanding and acceptance.   Dental advisory given  Plan Discussed with: CRNA and Surgeon  Anesthesia Plan Comments:         Anesthesia Quick Evaluation

## 2015-06-18 NOTE — H&P (Signed)
Anthony Tyler 04/01/2015 10:23 AM Location: [facility] Patient #: [id number] DOB: 05/26/1930 Married / Language: English / Race: White Male   History of Present Illness Rodman Key B. Hassell Done MD; 04/01/2015 10:43 AM) The patient is a 82 year old male who presents with an inguinal hernia. The hernia(s) is/are located on the left side. This has gotten aggravated by straining at stool and it may in fact contain his sigmoid colon. The only think that works for him is castor oil.  I previously performed a RIH and didn right colectomy. I discussed inguinal herniorrhaphy with him. Since he had one before he is pretty well aware of what to expect. I think that we could do him over [place] long keep him overnight for observation because of his age. He is followed by Donnie Coffin.   Other Problems Davy Pique Bynum, CMA; 04/01/2015 10:23 AM) Back Pain Depression High blood pressure  Past Surgical History Marjean Donna, CMA; 04/01/2015 10:23 AM) Cataract Surgery Bilateral.  Diagnostic Studies History Marjean Donna, CMA; 04/01/2015 10:23 AM) Colonoscopy 1-5 years ago  Medication History Marjean Donna, CMA; 04/01/2015 10:24 AM) QUEtiapine Fumarate (25MG  Tablet, Oral) Active. Tamsulosin HCl (0.4MG  Capsule, Oral) Active. Generlac (10GM/15ML Solution, Oral) Active. Medications Reconciled  Social History Marjean Donna, CMA; 04/01/2015 10:23 AM) No alcohol use No caffeine use No drug use Tobacco use Never smoker.  Family History Marjean Donna, Paul; 04/01/2015 10:23 AM) Family history unknown First Degree Relatives    Review of Systems Davy Pique Bynum CMA; 04/01/2015 10:23 AM) General Present- Weight Loss. Not Present- Appetite Loss, Chills, Fatigue, Fever, Night Sweats and Weight Gain. Skin Not Present- Change in Wart/Mole, Dryness, Hives, Jaundice, New Lesions, Non-Healing Wounds, Rash and Ulcer. HEENT Present- Hoarseness, Visual Disturbances and Wears glasses/contact lenses. Not  Present- Earache, Hearing Loss, Nose Bleed, Oral Ulcers, Ringing in the Ears, Seasonal Allergies, Sinus Pain, Sore Throat and Yellow Eyes. Respiratory Not Present- Bloody sputum, Chronic Cough, Difficulty Breathing, Snoring and Wheezing. Cardiovascular Present- Leg Cramps and Swelling of Extremities. Not Present- Chest Pain, Difficulty Breathing Lying Down, Palpitations, Rapid Heart Rate and Shortness of Breath. Gastrointestinal Present- Abdominal Pain, Change in Bowel Habits, Constipation and Rectal Pain. Not Present- Bloating, Bloody Stool, Chronic diarrhea, Difficulty Swallowing, Excessive gas, Gets full quickly at meals, Hemorrhoids, Indigestion, Nausea and Vomiting. Male Genitourinary Not Present- Blood in Urine, Change in Urinary Stream, Frequency, Impotence, Nocturia, Painful Urination, Urgency and Urine Leakage. Musculoskeletal Present- Back Pain and Joint Pain. Not Present- Joint Stiffness, Muscle Pain, Muscle Weakness and Swelling of Extremities. Neurological Present- Decreased Memory and Trouble walking. Not Present- Fainting, Headaches, Numbness, Seizures, Tingling, Tremor and Weakness.  Vitals (Sonya Bynum CMA; 04/01/2015 10:24 AM) 04/01/2015 10:23 AM Weight: 183 lb Height: 71in Body Surface Area: 2.03 m Body Mass Index: 25.52 kg/m  Pulse: 79 (Regular)  BP: 130/70 (Sitting, Left Arm, Standard)       Physical Exam (Rossana Molchan B. Hassell Done MD; 04/01/2015 10:45 AM) General Note: WDolder appearing man NAD with prominent rhynophyma Neck supple Chest clear Heart occasional ectopy noted. no murmurs abdomen large reducible left inguinal hernia ext FROM   ENMT Nose and Sinuses External Inspection of the Nose - asymmetric(rhynophyma prominent).    Assessment & Plan Rodman Key B. Hassell Done MD; 04/01/2015 10:48 AM) Leodis Liverpool (L71.1) LEFT INGUINAL HERNIA (K40.90) Impression: Will schedule open left inguinal hernia repair with overnight observation

## 2015-06-19 DIAGNOSIS — K4090 Unilateral inguinal hernia, without obstruction or gangrene, not specified as recurrent: Secondary | ICD-10-CM | POA: Diagnosis not present

## 2015-06-19 LAB — BASIC METABOLIC PANEL
ANION GAP: 9 (ref 5–15)
BUN: 13 mg/dL (ref 6–20)
CALCIUM: 8.6 mg/dL — AB (ref 8.9–10.3)
CO2: 24 mmol/L (ref 22–32)
Chloride: 107 mmol/L (ref 101–111)
Creatinine, Ser: 1.05 mg/dL (ref 0.61–1.24)
GFR calc Af Amer: >60 mL/min (ref >60)
Glucose, Bld: 124 mg/dL — ABNORMAL HIGH (ref 65–99)
POTASSIUM: 4.4 mmol/L (ref 3.5–5.1)
SODIUM: 140 mmol/L (ref 135–145)

## 2015-06-19 LAB — CBC
HCT: 38.7 % — ABNORMAL LOW (ref 39.0–52.0)
Hemoglobin: 13.2 g/dL (ref 13.0–17.0)
MCH: 32.3 pg (ref 26.0–34.0)
MCHC: 34.1 g/dL (ref 30.0–36.0)
MCV: 94.6 fL (ref 78.0–100.0)
PLATELETS: 135 10*3/uL — AB (ref 150–400)
RBC: 4.09 MIL/uL — AB (ref 4.22–5.81)
RDW: 13.7 % (ref 11.5–15.5)
WBC: 13.7 10*3/uL — AB (ref 4.0–10.5)

## 2015-06-19 MED ORDER — HYDROCODONE-ACETAMINOPHEN 5-325 MG PO TABS: 1.0000 | ORAL_TABLET | ORAL | Status: AC | PRN

## 2015-06-19 NOTE — Discharge Instructions (Signed)
HERNIA REPAIR: POST OP INSTRUCTIONS  1. DIET: Follow a light bland diet the first 24 hours after arrival home, such as soup, liquids, crackers, etc.  Be sure to include lots of fluids daily.  Avoid fast food or heavy meals as your are more likely to get nauseated.  Eat a low fat the next few days after surgery. 2. Take your usually prescribed home medications unless otherwise directed. 3. PAIN CONTROL: a. Pain is best controlled by a usual combination of three different methods TOGETHER: i. Ice/Heat ii. Over the counter pain medication iii. Prescription pain medication b. Most patients will experience some swelling and bruising around the hernia(s) such as the bellybutton, groins, or old incisions.  Ice packs or heating pads (30-60 minutes up to 6 times a day) will help. Use ice for the first few days to help decrease swelling and bruising, then switch to heat to help relax tight/sore spots and speed recovery.  Some people prefer to use ice alone, heat alone, alternating between ice & heat.  Experiment to what works for you.  Swelling and bruising can take several weeks to resolve.   c. It is helpful to take an over-the-counter pain medication regularly for the first few weeks.  Choose one of the following that works best for you: i. Naproxen (Aleve, etc)  Two 220mg  tabs twice a day ii. Ibuprofen (Advil, etc) Three 200mg  tabs four times a day (every meal & bedtime) d. A  prescription for pain medication should be given to you upon discharge.  Take your pain medication as prescribed.  i. If you are having problems/concerns with the prescription medicine (does not control pain, nausea, vomiting, rash, itching, etc), please call us [phone] to see if we need to switch you to a different pain medicine that will work better for you and/or control your side effect better. ii. If you need a refill on your pain medication, please contact your pharmacy.  They will contact our office to request  authorization. Prescriptions will not be filled after 5 pm or on week-ends. 4. Avoid getting constipated.  Between the surgery and the pain medications, it is common to experience some constipation.  Increasing fluid intake and taking a fiber supplement (such as Metamucil, Citrucel, FiberCon, MiraLax, etc) 1-2 times a day regularly will usually help prevent this problem from occurring.  A mild laxative (prune juice, Milk of Magnesia, MiraLax, etc) should be taken according to package directions if there are no bowel movements after 48 hours.   5. Wash / shower every day.  You may shower over the dressings as they are waterproof.   6. You may leave the incision open to air.  You may replace a dressing/Band-Aid to cover the incision for comfort if you wish.  Continue to shower over incision(s) after the dressing is off.  7. ACTIVITIES as tolerated:   a. You may resume regular (light) daily activities beginning the next day--such as daily self-care, walking, climbing stairs--gradually increasing activities as tolerated.  If you can walk 30 minutes without difficulty, it is safe to try more intense activity such as jogging, treadmill, bicycling, low-impact aerobics, swimming, etc. b. Save the most intensive and strenuous activity for last such as sit-ups, heavy lifting, contact sports, etc  Refrain from any heavy lifting or straining until you are off narcotics for pain control.   c. DO NOT PUSH THROUGH PAIN.  Let pain be your guide: If it hurts to do something, don't do it.  Pain is  your body warning you to avoid that activity for another week until the pain goes down. d. You may drive when you are no longer taking prescription pain medication, you can comfortably wear a seatbelt, and you can safely maneuver your car and apply brakes. e. Dennis Bast may have sexual intercourse when it is comfortable.  8. FOLLOW UP in our office a. Please call [facility] at [phone] to set up an appointment to see your surgeon in the  office for a follow-up appointment approximately 2-3 weeks after your surgery. b. Make sure that you call for this appointment the day you arrive home to insure a convenient appointment time. 9.  IF YOU HAVE DISABILITY OR FAMILY LEAVE FORMS, BRING THEM TO THE OFFICE FOR PROCESSING.  DO NOT GIVE THEM TO YOUR DOCTOR.  WHEN TO CALL us [phone]: 1. Poor pain control 2. Reactions / problems with new medications (rash/itching, nausea, etc)  3. Fever over 101.5 F (38.5 C) 4. Inability to urinate 5. Nausea and/or vomiting 6. Worsening swelling or bruising 7. Continued bleeding from incision. 8. Increased pain, redness, or drainage from the incision   The clinic staff is available to answer your questions during regular business hours (8:30am-5pm).  Please dont hesitate to call and ask to speak to one of our nurses for clinical concerns.   If you have a medical emergency, go to the nearest emergency room or call 911.  A surgeon from [facility] is always on call at the hospitals in [facility], [place], [place], [place], [place]  [address] ?  [address] [address], [place], [place]   [address] MAIN: [phone] ? TOLL FREE: [phone] ? FAX: [phone] [email or web address]

## 2015-06-19 NOTE — Discharge Summary (Signed)
Physician Discharge Summary  Patient ID: Anthony Tyler MRN: [id number] DOB/AGE: May 14, 1930 82 y.o.  Admit date: 06/18/2015 Discharge date: 06/19/2015  Admission Diagnoses: L inguinal hernia  Discharge Diagnoses:  Active Problems:   S/P left inguinal hernia repair   Discharged Condition: good  Hospital Course: patient admitted after surgery.  No acute events overnight.  Pt was discharged once tolerating a diet and pain was controlled.    Consults: None  Significant Diagnostic Studies: labs: cbc, chemistry  Treatments: IV hydration, analgesia: Vicodin and surgery: L ing hernia repair  Discharge Exam: Blood pressure 139/81, pulse 86, temperature 98.2 F (36.8 C), temperature source Oral, resp. rate 15, SpO2 100 %. General appearance: alert and cooperative GI: soft Incision/Wound: clean, dry, intact Ecchymosis noted in L scrotum  Disposition: 01-Home or Self Care     Medication List    TAKE these medications        acetaminophen 650 MG CR tablet  Commonly known as:  TYLENOL  Take 650 mg by mouth every 8 (eight) hours as needed for pain.     amLODipine 10 MG tablet  Commonly known as:  NORVASC  Take 10 mg by mouth every morning.     HYDROcodone-acetaminophen 5-325 MG tablet  Commonly known as:  NORCO/VICODIN  Take 1-2 tablets by mouth every 4 (four) hours as needed for moderate pain.     lactulose 10 GM/15ML solution  Commonly known as:  CHRONULAC  Take 15 mLs (10 g total) by mouth 2 (two) times daily as needed for mild constipation or moderate constipation.     memantine 10 MG tablet  Commonly known as:  NAMENDA  Take 10 mg by mouth 2 (two) times daily.     MUSCLE RUB 10-15 % Crea  Apply 1 application topically as needed for muscle pain.     polyvinyl alcohol 1.4 % ophthalmic solution  Commonly known as:  LIQUIFILM TEARS  Place 1 drop into both eyes 2 (two) times daily as needed for dry eyes.     QUEtiapine 25 MG tablet  Commonly known as:  SEROQUEL   Take 1 tablet (25 mg total) by mouth 2 (two) times daily as needed (Agitation).     traMADol 50 MG tablet  Commonly known as:  ULTRAM  Take 50 mg by mouth every 6 (six) hours as needed (Pain).           Follow-up Information    Follow up with Johnathan Hausen B, MD. Schedule an appointment as soon as possible for a visit in 2 weeks.   Specialty:  General Surgery   Contact information:   [address]  [place] [address] [phone]       Signed: Rosario Adie [id number], [id number] AM

## 2015-06-21 ENCOUNTER — Encounter (HOSPITAL_COMMUNITY): Payer: Self-pay | Admitting: Surgery

## 2015-06-28 DIAGNOSIS — R413 Other amnesia: Secondary | ICD-10-CM | POA: Diagnosis not present

## 2015-06-28 DIAGNOSIS — R5383 Other fatigue: Secondary | ICD-10-CM | POA: Diagnosis not present

## 2015-06-28 DIAGNOSIS — M79606 Pain in leg, unspecified: Secondary | ICD-10-CM | POA: Diagnosis not present

## 2015-06-28 DIAGNOSIS — G4700 Insomnia, unspecified: Secondary | ICD-10-CM | POA: Diagnosis not present

## 2015-06-30 DIAGNOSIS — M4806 Spinal stenosis, lumbar region: Secondary | ICD-10-CM | POA: Diagnosis not present

## 2015-07-13 DIAGNOSIS — H348112 Central retinal vein occlusion, right eye, stable: Secondary | ICD-10-CM | POA: Diagnosis not present

## 2015-07-13 DIAGNOSIS — H353112 Nonexudative age-related macular degeneration, right eye, intermediate dry stage: Secondary | ICD-10-CM | POA: Diagnosis not present

## 2015-07-13 DIAGNOSIS — H353122 Nonexudative age-related macular degeneration, left eye, intermediate dry stage: Secondary | ICD-10-CM | POA: Diagnosis not present

## 2015-07-20 DIAGNOSIS — F411 Generalized anxiety disorder: Secondary | ICD-10-CM | POA: Diagnosis not present

## 2015-07-20 DIAGNOSIS — F0390 Unspecified dementia without behavioral disturbance: Secondary | ICD-10-CM | POA: Diagnosis not present

## 2015-07-20 DIAGNOSIS — J309 Allergic rhinitis, unspecified: Secondary | ICD-10-CM | POA: Diagnosis not present

## 2015-09-20 DIAGNOSIS — R413 Other amnesia: Secondary | ICD-10-CM | POA: Diagnosis not present

## 2015-09-20 DIAGNOSIS — M545 Low back pain: Secondary | ICD-10-CM | POA: Diagnosis not present

## 2015-09-20 DIAGNOSIS — F329 Major depressive disorder, single episode, unspecified: Secondary | ICD-10-CM | POA: Diagnosis not present

## 2015-09-20 DIAGNOSIS — I10 Essential (primary) hypertension: Secondary | ICD-10-CM | POA: Diagnosis not present

## 2015-11-23 DIAGNOSIS — Z961 Presence of intraocular lens: Secondary | ICD-10-CM | POA: Diagnosis not present

## 2015-11-23 DIAGNOSIS — H348312 Tributary (branch) retinal vein occlusion, right eye, stable: Secondary | ICD-10-CM | POA: Diagnosis not present

## 2015-11-23 DIAGNOSIS — H35371 Puckering of macula, right eye: Secondary | ICD-10-CM | POA: Diagnosis not present

## 2015-11-23 DIAGNOSIS — H35033 Hypertensive retinopathy, bilateral: Secondary | ICD-10-CM | POA: Diagnosis not present

## 2016-01-24 DIAGNOSIS — H26493 Other secondary cataract, bilateral: Secondary | ICD-10-CM | POA: Diagnosis not present

## 2016-04-12 DIAGNOSIS — M545 Low back pain: Secondary | ICD-10-CM | POA: Diagnosis not present

## 2016-04-12 DIAGNOSIS — I10 Essential (primary) hypertension: Secondary | ICD-10-CM | POA: Diagnosis not present

## 2016-04-12 DIAGNOSIS — R413 Other amnesia: Secondary | ICD-10-CM | POA: Diagnosis not present

## 2016-05-23 DIAGNOSIS — H35371 Puckering of macula, right eye: Secondary | ICD-10-CM | POA: Diagnosis not present

## 2016-05-23 DIAGNOSIS — H348312 Tributary (branch) retinal vein occlusion, right eye, stable: Secondary | ICD-10-CM | POA: Diagnosis not present

## 2016-05-23 DIAGNOSIS — H35033 Hypertensive retinopathy, bilateral: Secondary | ICD-10-CM | POA: Diagnosis not present

## 2016-05-23 DIAGNOSIS — Z961 Presence of intraocular lens: Secondary | ICD-10-CM | POA: Diagnosis not present

## 2016-05-30 DIAGNOSIS — M545 Low back pain: Secondary | ICD-10-CM | POA: Diagnosis not present

## 2016-08-17 DIAGNOSIS — I10 Essential (primary) hypertension: Secondary | ICD-10-CM | POA: Diagnosis not present

## 2016-08-17 DIAGNOSIS — R5383 Other fatigue: Secondary | ICD-10-CM | POA: Diagnosis not present

## 2016-08-17 DIAGNOSIS — R609 Edema, unspecified: Secondary | ICD-10-CM | POA: Diagnosis not present

## 2016-08-22 DIAGNOSIS — H34831 Tributary (branch) retinal vein occlusion, right eye, with macular edema: Secondary | ICD-10-CM | POA: Diagnosis not present

## 2016-08-22 DIAGNOSIS — H35033 Hypertensive retinopathy, bilateral: Secondary | ICD-10-CM | POA: Diagnosis not present

## 2016-08-22 DIAGNOSIS — H35371 Puckering of macula, right eye: Secondary | ICD-10-CM | POA: Diagnosis not present

## 2016-08-22 DIAGNOSIS — Z961 Presence of intraocular lens: Secondary | ICD-10-CM | POA: Diagnosis not present

## 2016-09-05 DIAGNOSIS — M545 Low back pain: Secondary | ICD-10-CM | POA: Diagnosis not present

## 2016-09-06 ENCOUNTER — Encounter (HOSPITAL_COMMUNITY): Payer: Self-pay | Admitting: Emergency Medicine

## 2016-09-06 ENCOUNTER — Emergency Department (HOSPITAL_COMMUNITY)
Admission: EM | Admit: 2016-09-06 | Discharge: 2016-09-06 | Disposition: A | Discharge status: Home / Self Care | Payer: PPO | Attending: Emergency Medicine | Admitting: Emergency Medicine

## 2016-09-06 DIAGNOSIS — R202 Paresthesia of skin: Secondary | ICD-10-CM | POA: Insufficient documentation

## 2016-09-06 DIAGNOSIS — Z79899 Other long term (current) drug therapy: Secondary | ICD-10-CM | POA: Insufficient documentation

## 2016-09-06 DIAGNOSIS — M545 Low back pain: Secondary | ICD-10-CM | POA: Diagnosis not present

## 2016-09-06 DIAGNOSIS — I10 Essential (primary) hypertension: Secondary | ICD-10-CM | POA: Insufficient documentation

## 2016-09-06 DIAGNOSIS — G309 Alzheimer's disease, unspecified: Secondary | ICD-10-CM | POA: Diagnosis not present

## 2016-09-06 DIAGNOSIS — G8929 Other chronic pain: Secondary | ICD-10-CM | POA: Diagnosis not present

## 2016-09-06 MED ORDER — METHOCARBAMOL 500 MG PO TABS: 500.0000 mg | ORAL_TABLET | Freq: Two times a day (BID) | ORAL | 0 refills | Status: AC

## 2016-09-06 NOTE — ED Provider Notes (Signed)
[facility] DEPT Provider Note   CSN: [id number] Arrival date & time: 09/06/16  1627     History   Chief Complaint Chief Complaint  Patient presents with  . Back Pain  . Numbness    HPI ISMEAL HEIDER is a 83 y.o. male.  83 year old male presents with worsening chronic back pain 6 months. Saw his physician yesterday for similar symptoms and was prescribed Percocet. Was told he would need to have an outpatient MRI. Patient denies any lower bladder dysfunction. Denies any new paresthesias in his legs. No recent history of trauma. Has used Percocet with limited relief. Denies any perineal numbness.      Past Medical History:  Diagnosis Date  . Alzheimer disease    1st stage  . Chronic back pain    stenosis  . Dry eyes   . Edema    in feet   . Enlargement of the nose    OUTER NOSE VERY ENLARGED--STATES HE SOMETIMES MASHES BUMPS ON THE NOSE AND BLOOD WILL COME OUT OF BUMPS.  PT DOES NOT HAVE PCP--SAW A DERMATOLOGIST ONCE--BUT STOPPED THE "PILLS" HE WAS GIVEN-DIDN'T LIKE THE WAY THE MED MADE HIM FEEL  . Glaucoma    BOTH EYES  . Hemorrhoids   . Hypertension    takes Amlodipine daily  . Inguinal hernia    left  . Partial blindness    RIGHT EYE  . Urinary frequency   . Urinary urgency   . Warts    multiple on right ear lobe  . Weakness    numbness and tingling    Patient Active Problem List   Diagnosis Date Noted  . S/P left inguinal hernia repair 06/18/2015  . Altered bowel function 04/29/2015  . Bleeding chronic gastric ulcer 04/29/2015  . CN (constipation) 04/29/2015  . Essential hypertension, benign 04/29/2015  . Hernia, inguinal 04/29/2015  . LBP (low back pain) 04/29/2015  . Major depressive disorder with single episode 04/29/2015  . Bad memory 04/29/2015  . Essential (primary) hypertension 03/23/2015  . Alzheimer's dementia 01/27/2015  . Premature ventricular contractions (PVCs) (VPCs)   . Essential hypertension   . Spinal stenosis of lumbar  region with neurogenic claudication 01/25/2015  . Peptic ulcer disease 08/04/2013  . Acute blood loss anemia 08/03/2013  . GI bleed 08/02/2013  . Benign Cecal Adenoma-Lap Asst Right Colectomy September 2013 11/02/2011  . 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 Malaise and fatigue 07/18/2011  . Allergic rhinitis 06/19/2011    Past Surgical History:  Procedure Laterality Date  . CATARACT EXTRACTION Bilateral 03/2011  . COLONOSCOPY    . ESOPHAGOGASTRODUODENOSCOPY N/A 08/04/2013   Procedure: ESOPHAGOGASTRODUODENOSCOPY (EGD);  Surgeon: Wonda Horner, MD;  Location: [facility];  Service: Endoscopy;  Laterality: N/A;  . EYE SURGERY     RETINAL SURGERY RT EYE FOR EYE HEMORRHAGE--THEN HAD CATARACT REMOVED RT EYE  . HEMICOLECTOMY    . HERNIA REPAIR Right 2008  . INGUINAL HERNIA REPAIR Left 06/18/2015   Procedure: OPEN LEFT INGUINAL  HERNIA ;  Surgeon: Johnathan Hausen, MD;  Location: [facility];  Service: General;  Laterality: Left;  With MESH  . LUMBAR LAMINECTOMY WITH COFLEX 2 LEVEL N/A 01/25/2015   Procedure: Lumbar Two-Three, Lumbar Three-Four Laminectomy with coflex;  Surgeon: Kristeen Miss, MD;  Location: [facility];  Service: Neurosurgery;  Laterality: N/A;  L2-3 L3-4 Laminectomy with coflex  . TONSILLECTOMY     AGE 51       Home Medications    Prior to Admission medications  Medication Sig Start Date End Date Taking? Authorizing Provider  acetaminophen (TYLENOL) 650 MG CR tablet Take 650 mg by mouth every 8 (eight) hours as needed for pain.    [provider]  amLODipine (NORVASC) 10 MG tablet Take 10 mg by mouth every morning.     [provider]  HYDROcodone-acetaminophen (NORCO/VICODIN) 5-325 MG tablet Take 1-2 tablets by mouth every 4 (four) hours as needed for moderate pain. 3/71/06   Leighton Ruff, MD  lactulose (CHRONULAC) 10 GM/15ML solution Take 15 mLs (10 g total) by mouth 2 (two) times daily as needed for mild constipation or moderate constipation. Patient not taking:  Reported on 06/02/2015 02/01/15   Orpah Greek, MD  memantine (NAMENDA) 10 MG tablet Take 10 mg by mouth 2 (two) times daily.    [provider]  Menthol-Methyl Salicylate (MUSCLE RUB) 10-15 % CREA Apply 1 application topically as needed for muscle pain.    [provider]  polyvinyl alcohol (LIQUIFILM TEARS) 1.4 % ophthalmic solution Place 1 drop into both eyes 2 (two) times daily as needed for dry eyes.    [provider]  QUEtiapine (SEROQUEL) 25 MG tablet Take 1 tablet (25 mg total) by mouth 2 (two) times daily as needed (Agitation). Patient not taking: Reported on 06/02/2015 01/29/15   Kristeen Miss, MD  traMADol (ULTRAM) 50 MG tablet Take 50 mg by mouth every 6 (six) hours as needed (Pain).    [provider]    Family History Family History  Problem Relation Age of Onset  . Stroke Mother   . Atrial fibrillation Sister        X2  . Arrhythmia Sister   . Atrial fibrillation Brother        X2  . Arrhythmia Brother   . Heart attack Neg Hx   . Hypertension Brother     Social History Social History  Substance Use Topics  . Smoking status: Never Smoker  . Smokeless tobacco: Never Used  . Alcohol use No     Allergies   Donepezil   Review of Systems Review of Systems  All other systems reviewed and are negative.    Physical Exam Updated Vital Signs BP (!) 158/93 (BP Location: Left Arm)   Pulse 72   Temp 98.3 F (36.8 C) (Oral)   Resp 18   Ht 1.88 m (6\' 2" )   Wt 70.1 kg (154 lb 7 oz)   SpO2 100%   BMI 19.83 kg/m   Physical Exam  Constitutional: He is oriented to person, place, and time. He appears well-developed and well-nourished.  Non-toxic appearance. No distress.  HENT:  Head: Normocephalic and atraumatic.  Eyes: Conjunctivae, EOM and lids are normal. Pupils are equal, round, and reactive to light.  Neck: Normal range of motion. Neck supple. No tracheal deviation present. No thyroid mass present.  Cardiovascular:  Normal rate, regular rhythm and normal heart sounds.  Exam reveals no gallop.   No murmur heard. Pulmonary/Chest: Effort normal and breath sounds normal. No stridor. No respiratory distress. He has no decreased breath sounds. He has no wheezes. He has no rhonchi. He has no rales.  Abdominal: Soft. Normal appearance and bowel sounds are normal. He exhibits no distension. There is no tenderness. There is no rebound and no CVA tenderness.  Musculoskeletal: Normal range of motion. He exhibits no edema or tenderness.  Nontender along the spinous processes.  Neurological: He is alert and oriented to person, place, and time. He has normal strength.  No cranial nerve deficit or sensory deficit. GCS eye subscore is 4. GCS verbal subscore is 5. GCS motor subscore is 6.  Patient has normal straight leg raise on both lower extremities. Full range of motion at both lower extremity veins.  Skin: Skin is warm and dry. No abrasion and no rash noted.  Psychiatric: He has a normal mood and affect. His speech is normal and behavior is normal.  Nursing note and vitals reviewed.    ED Treatments / Results  Labs (all labs ordered are listed, but only abnormal results are displayed) Labs Reviewed - No data to display  EKG  EKG Interpretation None       Radiology No results found.  Procedures Procedures (including critical care time)  Medications Ordered in ED Medications - No data to display   Initial Impression / Assessment and Plan / ED Course  I have reviewed the triage vital signs and the nursing notes.  Pertinent labs & imaging results that were available during my care of the patient were reviewed by me and considered in my medical decision making (see chart for details).     Patient has no red flags for cauda equina at this time. Patient states that his paresthesias are at baseline. I will add a muscle relaxant and he will follow-up with his physician.  Final Clinical Impressions(s) / ED  Diagnoses   Final diagnoses:  None    New Prescriptions New Prescriptions   No medications on file     Lacretia Leigh, MD 09/06/16 2124

## 2016-09-06 NOTE — ED Triage Notes (Signed)
Pt c/o exacerbation of chronic back pain x 6 months, back pain x several years, numbness bilaterally from lower back to distal feet x 1 week. No new bowel or bladder changes. Ambulatory. Pt's MD at [facility] wanted pt to have MRI, per family.

## 2016-09-07 ENCOUNTER — Other Ambulatory Visit: Payer: Self-pay | Admitting: Family Medicine

## 2016-09-07 DIAGNOSIS — M545 Low back pain, unspecified: Secondary | ICD-10-CM

## 2016-09-07 DIAGNOSIS — R200 Anesthesia of skin: Secondary | ICD-10-CM

## 2016-09-09 ENCOUNTER — Ambulatory Visit
Admission: RE | Admit: 2016-09-09 | Discharge: 2016-09-09 | Disposition: A | Discharge status: Home / Self Care | Payer: PPO | Source: Ambulatory Visit | Attending: Family Medicine | Admitting: Family Medicine

## 2016-09-09 DIAGNOSIS — R200 Anesthesia of skin: Secondary | ICD-10-CM

## 2016-09-09 DIAGNOSIS — M545 Low back pain, unspecified: Secondary | ICD-10-CM

## 2016-09-09 DIAGNOSIS — M5126 Other intervertebral disc displacement, lumbar region: Secondary | ICD-10-CM | POA: Diagnosis not present

## 2016-09-18 DIAGNOSIS — M48062 Spinal stenosis, lumbar region with neurogenic claudication: Secondary | ICD-10-CM | POA: Diagnosis not present

## 2016-10-02 DIAGNOSIS — M48061 Spinal stenosis, lumbar region without neurogenic claudication: Secondary | ICD-10-CM | POA: Diagnosis not present

## 2016-10-02 DIAGNOSIS — M5136 Other intervertebral disc degeneration, lumbar region: Secondary | ICD-10-CM | POA: Diagnosis not present

## 2016-10-02 DIAGNOSIS — M4726 Other spondylosis with radiculopathy, lumbar region: Secondary | ICD-10-CM | POA: Diagnosis not present

## 2016-10-10 DIAGNOSIS — I10 Essential (primary) hypertension: Secondary | ICD-10-CM | POA: Diagnosis not present

## 2016-10-10 DIAGNOSIS — R413 Other amnesia: Secondary | ICD-10-CM | POA: Diagnosis not present

## 2016-10-10 DIAGNOSIS — M545 Low back pain: Secondary | ICD-10-CM | POA: Diagnosis not present

## 2016-10-11 DIAGNOSIS — M48062 Spinal stenosis, lumbar region with neurogenic claudication: Secondary | ICD-10-CM | POA: Diagnosis not present

## 2016-11-09 DIAGNOSIS — M48062 Spinal stenosis, lumbar region with neurogenic claudication: Secondary | ICD-10-CM | POA: Diagnosis not present

## 2016-11-09 DIAGNOSIS — I10 Essential (primary) hypertension: Secondary | ICD-10-CM | POA: Diagnosis not present

## 2017-01-23 DIAGNOSIS — H34831 Tributary (branch) retinal vein occlusion, right eye, with macular edema: Secondary | ICD-10-CM | POA: Diagnosis not present

## 2017-01-23 DIAGNOSIS — H35033 Hypertensive retinopathy, bilateral: Secondary | ICD-10-CM | POA: Diagnosis not present

## 2017-01-23 DIAGNOSIS — H35371 Puckering of macula, right eye: Secondary | ICD-10-CM | POA: Diagnosis not present

## 2017-01-23 DIAGNOSIS — Z961 Presence of intraocular lens: Secondary | ICD-10-CM | POA: Diagnosis not present

## 2017-04-09 ENCOUNTER — Encounter (HOSPITAL_COMMUNITY): Payer: Self-pay | Admitting: Internal Medicine

## 2017-04-09 ENCOUNTER — Emergency Department (HOSPITAL_COMMUNITY): Payer: PPO

## 2017-04-09 ENCOUNTER — Emergency Department (HOSPITAL_COMMUNITY)
Admission: EM | Admit: 2017-04-09 | Discharge: 2017-04-09 | Disposition: A | Discharge status: Home / Self Care | Payer: PPO | Attending: Emergency Medicine | Admitting: Emergency Medicine

## 2017-04-09 DIAGNOSIS — K5900 Constipation, unspecified: Secondary | ICD-10-CM | POA: Insufficient documentation

## 2017-04-09 DIAGNOSIS — I10 Essential (primary) hypertension: Secondary | ICD-10-CM | POA: Diagnosis not present

## 2017-04-09 DIAGNOSIS — G309 Alzheimer's disease, unspecified: Secondary | ICD-10-CM | POA: Insufficient documentation

## 2017-04-09 DIAGNOSIS — R402441 Other coma, without documented Glasgow coma scale score, or with partial score reported, in the field [EMT or ambulance]: Secondary | ICD-10-CM | POA: Diagnosis not present

## 2017-04-09 DIAGNOSIS — F0280 Dementia in other diseases classified elsewhere without behavioral disturbance: Secondary | ICD-10-CM | POA: Insufficient documentation

## 2017-04-09 DIAGNOSIS — Z79899 Other long term (current) drug therapy: Secondary | ICD-10-CM | POA: Diagnosis not present

## 2017-04-09 DIAGNOSIS — R140 Abdominal distension (gaseous): Secondary | ICD-10-CM | POA: Diagnosis not present

## 2017-04-09 MED ORDER — FLEET ENEMA 7-19 GM/118ML RE ENEM
1.0000 | ENEMA | Freq: Once | RECTAL | Status: AC
Start: 2017-04-09 — End: 2017-04-09
  Administered 2017-04-09: 1 via RECTAL
  Filled 2017-04-09: qty 1

## 2017-04-09 NOTE — ED Notes (Signed)
After rectal stimulation, pt still unable to pass stool. Dr. Francia Greaves made aware. Patient placed back in bed off bed side commode.

## 2017-04-09 NOTE — ED Provider Notes (Signed)
[facility] DEPT Provider Note   CSN: [id number] Arrival date & time: 04/09/17  1124     History   Chief Complaint Chief Complaint  Patient presents with  . Constipation    HPI Anthony Tyler is a 84 y.o. male.  84 year old male with history of dementia, hypertension, and constipation presents with complaint of constipation.  Family reports that the patient has not had a significant bowel movement in the last week.  Patient reports reportedly intermittently refuses to take some of his oral medications prescribed to prevent constipation.  Patient denies abdominal pain.  There is no reported nausea or vomiting.  Family reports no fever.   The history is provided by the patient.  Constipation   This is a recurrent problem. The current episode started more than 1 week ago. Pertinent negatives include no abdominal pain. There is fiber in the patient's diet. He does not exercise regularly. He has tried stimulants for the symptoms. The treatment provided no relief. Risk factors include immobility.    Past Medical History:  Diagnosis Date  . Alzheimer disease    1st stage  . Chronic back pain    stenosis  . Dry eyes   . Edema    in feet   . Enlargement of the nose    OUTER NOSE VERY ENLARGED--STATES HE SOMETIMES MASHES BUMPS ON THE NOSE AND BLOOD WILL COME OUT OF BUMPS.  PT DOES NOT HAVE PCP--SAW A DERMATOLOGIST ONCE--BUT STOPPED THE "PILLS" HE WAS GIVEN-DIDN'T LIKE THE WAY THE MED MADE HIM FEEL  . Glaucoma    BOTH EYES  . Hemorrhoids   . Hypertension    takes Amlodipine daily  . Inguinal hernia    left  . Partial blindness    RIGHT EYE  . Urinary frequency   . Urinary urgency   . Warts    multiple on right ear lobe  . Weakness    numbness and tingling    Patient Active Problem List   Diagnosis Date Noted  . S/P left inguinal hernia repair 06/18/2015  . Altered bowel function 04/29/2015  . Bleeding chronic gastric ulcer 04/29/2015    . CN (constipation) 04/29/2015  . Essential hypertension, benign 04/29/2015  . Hernia, inguinal 04/29/2015  . LBP (low back pain) 04/29/2015  . Major depressive disorder with single episode 04/29/2015  . Bad memory 04/29/2015  . Essential (primary) hypertension 03/23/2015  . Alzheimer's dementia 01/27/2015  . Premature ventricular contractions (PVCs) (VPCs)   . Essential hypertension   . Spinal stenosis of lumbar region with neurogenic claudication 01/25/2015  . Peptic ulcer disease 08/04/2013  . Acute blood loss anemia 08/03/2013  . GI bleed 08/02/2013  . Benign Cecal Adenoma-Lap Asst Right Colectomy September 2013 11/02/2011  . 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 Malaise and fatigue 07/18/2011  . Allergic rhinitis 06/19/2011    Past Surgical History:  Procedure Laterality Date  . CATARACT EXTRACTION Bilateral 03/2011  . COLONOSCOPY    . ESOPHAGOGASTRODUODENOSCOPY N/A 08/04/2013   Procedure: ESOPHAGOGASTRODUODENOSCOPY (EGD);  Surgeon: Wonda Horner, MD;  Location: [facility];  Service: Endoscopy;  Laterality: N/A;  . EYE SURGERY     RETINAL SURGERY RT EYE FOR EYE HEMORRHAGE--THEN HAD CATARACT REMOVED RT EYE  . HEMICOLECTOMY    . HERNIA REPAIR Right 2008  . INGUINAL HERNIA REPAIR Left 06/18/2015   Procedure: OPEN LEFT INGUINAL  HERNIA ;  Surgeon: Johnathan Hausen, MD;  Location: [facility];  Service: General;  Laterality: Left;  With MESH  .  LUMBAR LAMINECTOMY WITH COFLEX 2 LEVEL N/A 01/25/2015   Procedure: Lumbar Two-Three, Lumbar Three-Four Laminectomy with coflex;  Surgeon: Kristeen Miss, MD;  Location: [facility];  Service: Neurosurgery;  Laterality: N/A;  L2-3 L3-4 Laminectomy with coflex  . TONSILLECTOMY     AGE 80       Home Medications    Prior to Admission medications   Medication Sig Start Date End Date Taking? Authorizing Provider  acetaminophen (TYLENOL) 650 MG CR tablet Take 650 mg by mouth every 8 (eight) hours as needed for pain.   Yes [provider]  amLODipine  (NORVASC) 10 MG tablet Take 10 mg by mouth every morning.    Yes [provider]  memantine (NAMENDA) 10 MG tablet Take 10 mg by mouth 2 (two) times daily.   Yes [provider]  traMADol (ULTRAM) 50 MG tablet Take 50 mg by mouth every 6 (six) hours as needed (Pain).   Yes [provider]  HYDROcodone-acetaminophen (NORCO/VICODIN) 5-325 MG tablet Take 1-2 tablets by mouth every 4 (four) hours as needed for moderate pain. Patient not taking: Reported on 04/09/2017 0/73/71   Leighton Ruff, MD  lactulose [facility]) 10 GM/15ML solution Take 15 mLs (10 g total) by mouth 2 (two) times daily as needed for mild constipation or moderate constipation. Patient not taking: Reported on 06/02/2015 02/01/15   Orpah Greek, MD  methocarbamol (ROBAXIN) 500 MG tablet Take 1 tablet (500 mg total) by mouth 2 (two) times daily. Patient not taking: Reported on 04/09/2017 09/06/16   Lacretia Leigh, MD  QUEtiapine (SEROQUEL) 25 MG tablet Take 1 tablet (25 mg total) by mouth 2 (two) times daily as needed (Agitation). 01/29/15   Kristeen Miss, MD    Family History Family History  Problem Relation Age of Onset  . Stroke Mother   . Atrial fibrillation Sister        X2  . Arrhythmia Sister   . Atrial fibrillation Brother        X2  . Arrhythmia Brother   . Hypertension Brother   . Heart attack Neg Hx     Social History Social History   Tobacco Use  . Smoking status: Never Smoker  . Smokeless tobacco: Never Used  Substance Use Topics  . Alcohol use: No  . Drug use: No     Allergies   Donepezil   Review of Systems Review of Systems  Gastrointestinal: Positive for constipation. Negative for abdominal pain.  All other systems reviewed and are negative.    Physical Exam Updated Vital Signs BP (!) 162/94 (BP Location: Left Arm)   Pulse 86   Temp 99.1 F (37.3 C) (Oral)   Resp 14   Ht 6\' 2"  (1.88 m)   Wt 81.6 kg (180 lb)   SpO2 99%   BMI 23.11 kg/m   Physical  Exam  Constitutional: He is oriented to person, place, and time. He appears well-developed and well-nourished. No distress.  HENT:  Head: Normocephalic and atraumatic.  Mouth/Throat: Oropharynx is clear and moist.  Eyes: Conjunctivae and EOM are normal. Pupils are equal, round, and reactive to light.  Neck: Normal range of motion. Neck supple.  Cardiovascular: Normal rate, regular rhythm and normal heart sounds.  Pulmonary/Chest: Effort normal and breath sounds normal. No respiratory distress.  Abdominal: Soft. He exhibits no distension. There is no tenderness.  Musculoskeletal: Normal range of motion. He exhibits no edema or deformity.  Neurological: He is alert and oriented to person, place, and time.  A sensory deficit is present.  Skin: Skin is warm and dry.  Psychiatric: He has a normal mood and affect.  Nursing note and vitals reviewed.    ED Treatments / Results  Labs (all labs ordered are listed, but only abnormal results are displayed) Labs Reviewed - No data to display  EKG  EKG Interpretation None       Radiology Dg Abdomen Acute W/chest  Result Date: 04/09/2017 CLINICAL DATA:  Abdominal pain. EXAM: DG ABDOMEN ACUTE W/ 1V CHEST COMPARISON:  02/01/2015 FINDINGS: The lungs are clear without focal pneumonia, edema, pneumothorax or pleural effusion. The cardiopericardial silhouette is within normal limits for size. The visualized bony structures of the thorax are intact. Upright film shows no evidence for intraperitoneal free air. Mild diffuse gaseous colonic distention noted with prominent stool volume in the rectum. Fixation device noted posterior elements L3-4. IMPRESSION: 1. No acute cardiopulmonary findings. 2. Diffuse gaseous distention of colon with prominent stool in the distal colon. Imaging features could be compatible with clinical constipation. Electronically Signed   By: Misty Stanley M.D.   On: 04/09/2017 12:56    Procedures Procedures (including critical care  time)  Medications Ordered in ED Medications  sodium phosphate (FLEET) 7-19 GM/118ML enema 1 enema (not administered)     Initial Impression / Assessment and Plan / ED Course  I have reviewed the triage vital signs and the nursing notes.  Pertinent labs & imaging results that were available during my care of the patient were reviewed by me and considered in my medical decision making (see chart for details).     MDM  Screen Complete  Patient's presentation today is consistent with constipation.  Patient with long-standing history of same.  Patient's screening abdominal x-ray does not reveal other acute abdominal pathology.  Patient improved following a fleets enema and digital disimpaction performed in the ED. Moderate stool was removed by myself during the disimpaction.  Patient tolerated this procedure well.  Family understands need for close follow-up.  Strict return precautions given and understood.  Family reports the patient already has an appropriate bowel regimen at home - he sometimes refuses to take it.   Final Clinical Impressions(s) / ED Diagnoses   Final diagnoses:  Constipation, unspecified constipation type    ED Discharge Orders    None       Valarie Merino, MD 04/09/17 1447

## 2017-04-09 NOTE — Discharge Instructions (Signed)
Please return for any problem.  °

## 2017-04-09 NOTE — ED Notes (Signed)
Patient unable to pass stool with fleets enema. Small amounts of diarrhea passed. Will notify physician.

## 2017-04-09 NOTE — ED Triage Notes (Signed)
Pt arrived via [facility] from home. Family states that he has not had a bowel movement since last week. Patient has hx of alzheimer's dementia and refuses all laxatives and medications from family.

## 2017-04-12 DIAGNOSIS — K5900 Constipation, unspecified: Secondary | ICD-10-CM | POA: Diagnosis not present

## 2017-04-12 DIAGNOSIS — R413 Other amnesia: Secondary | ICD-10-CM | POA: Diagnosis not present

## 2017-04-12 DIAGNOSIS — M545 Low back pain: Secondary | ICD-10-CM | POA: Diagnosis not present

## 2017-04-12 DIAGNOSIS — I10 Essential (primary) hypertension: Secondary | ICD-10-CM | POA: Diagnosis not present

## 2017-06-05 DIAGNOSIS — M791 Myalgia, unspecified site: Secondary | ICD-10-CM | POA: Diagnosis not present

## 2017-06-05 DIAGNOSIS — M545 Low back pain: Secondary | ICD-10-CM | POA: Diagnosis not present

## 2017-08-16 DIAGNOSIS — M549 Dorsalgia, unspecified: Secondary | ICD-10-CM | POA: Diagnosis not present

## 2017-08-22 DIAGNOSIS — H524 Presbyopia: Secondary | ICD-10-CM | POA: Diagnosis not present

## 2017-08-22 DIAGNOSIS — Z961 Presence of intraocular lens: Secondary | ICD-10-CM | POA: Diagnosis not present

## 2017-09-12 DIAGNOSIS — I10 Essential (primary) hypertension: Secondary | ICD-10-CM | POA: Diagnosis not present

## 2017-09-12 DIAGNOSIS — R5383 Other fatigue: Secondary | ICD-10-CM | POA: Diagnosis not present

## 2017-09-12 DIAGNOSIS — R413 Other amnesia: Secondary | ICD-10-CM | POA: Diagnosis not present

## 2017-09-12 DIAGNOSIS — N3000 Acute cystitis without hematuria: Secondary | ICD-10-CM | POA: Diagnosis not present

## 2017-09-27 DIAGNOSIS — R5383 Other fatigue: Secondary | ICD-10-CM | POA: Diagnosis not present

## 2017-09-27 DIAGNOSIS — R413 Other amnesia: Secondary | ICD-10-CM | POA: Diagnosis not present

## 2017-09-27 DIAGNOSIS — N3000 Acute cystitis without hematuria: Secondary | ICD-10-CM | POA: Diagnosis not present

## 2017-09-28 DIAGNOSIS — H35033 Hypertensive retinopathy, bilateral: Secondary | ICD-10-CM | POA: Diagnosis not present

## 2017-09-28 DIAGNOSIS — H34831 Tributary (branch) retinal vein occlusion, right eye, with macular edema: Secondary | ICD-10-CM | POA: Diagnosis not present

## 2017-09-28 DIAGNOSIS — H35371 Puckering of macula, right eye: Secondary | ICD-10-CM | POA: Diagnosis not present

## 2017-09-28 DIAGNOSIS — Z961 Presence of intraocular lens: Secondary | ICD-10-CM | POA: Diagnosis not present

## 2017-10-02 DIAGNOSIS — N3000 Acute cystitis without hematuria: Secondary | ICD-10-CM | POA: Diagnosis not present

## 2017-11-01 DIAGNOSIS — H1033 Unspecified acute conjunctivitis, bilateral: Secondary | ICD-10-CM | POA: Diagnosis not present

## 2017-12-04 DIAGNOSIS — H1045 Other chronic allergic conjunctivitis: Secondary | ICD-10-CM | POA: Diagnosis not present

## 2018-01-03 DIAGNOSIS — R5383 Other fatigue: Secondary | ICD-10-CM | POA: Diagnosis not present

## 2018-01-03 DIAGNOSIS — F0390 Unspecified dementia without behavioral disturbance: Secondary | ICD-10-CM | POA: Diagnosis not present

## 2018-01-24 DIAGNOSIS — I499 Cardiac arrhythmia, unspecified: Secondary | ICD-10-CM | POA: Diagnosis not present

## 2018-01-24 DIAGNOSIS — R0689 Other abnormalities of breathing: Secondary | ICD-10-CM | POA: Diagnosis not present

## 2018-01-24 DIAGNOSIS — R404 Transient alteration of awareness: Secondary | ICD-10-CM | POA: Diagnosis not present

## 2018-01-24 DIAGNOSIS — R000 Tachycardia, unspecified: Secondary | ICD-10-CM | POA: Diagnosis not present

## 2018-01-27 DIAGNOSIS — 419620001 Death: Secondary | SNOMED CT | POA: Diagnosis not present

## 2018-01-27 DEATH — deceased

## 2018-04-14 IMAGING — CR DG ABDOMEN ACUTE W/ 1V CHEST
3 series · 3 of 3 positions shown · non-contrast
Comparison: 02/01/2015

CLINICAL DATA: Abdominal pain.

EXAM:
DG ABDOMEN ACUTE W/ 1V CHEST

[w abdomen upright]
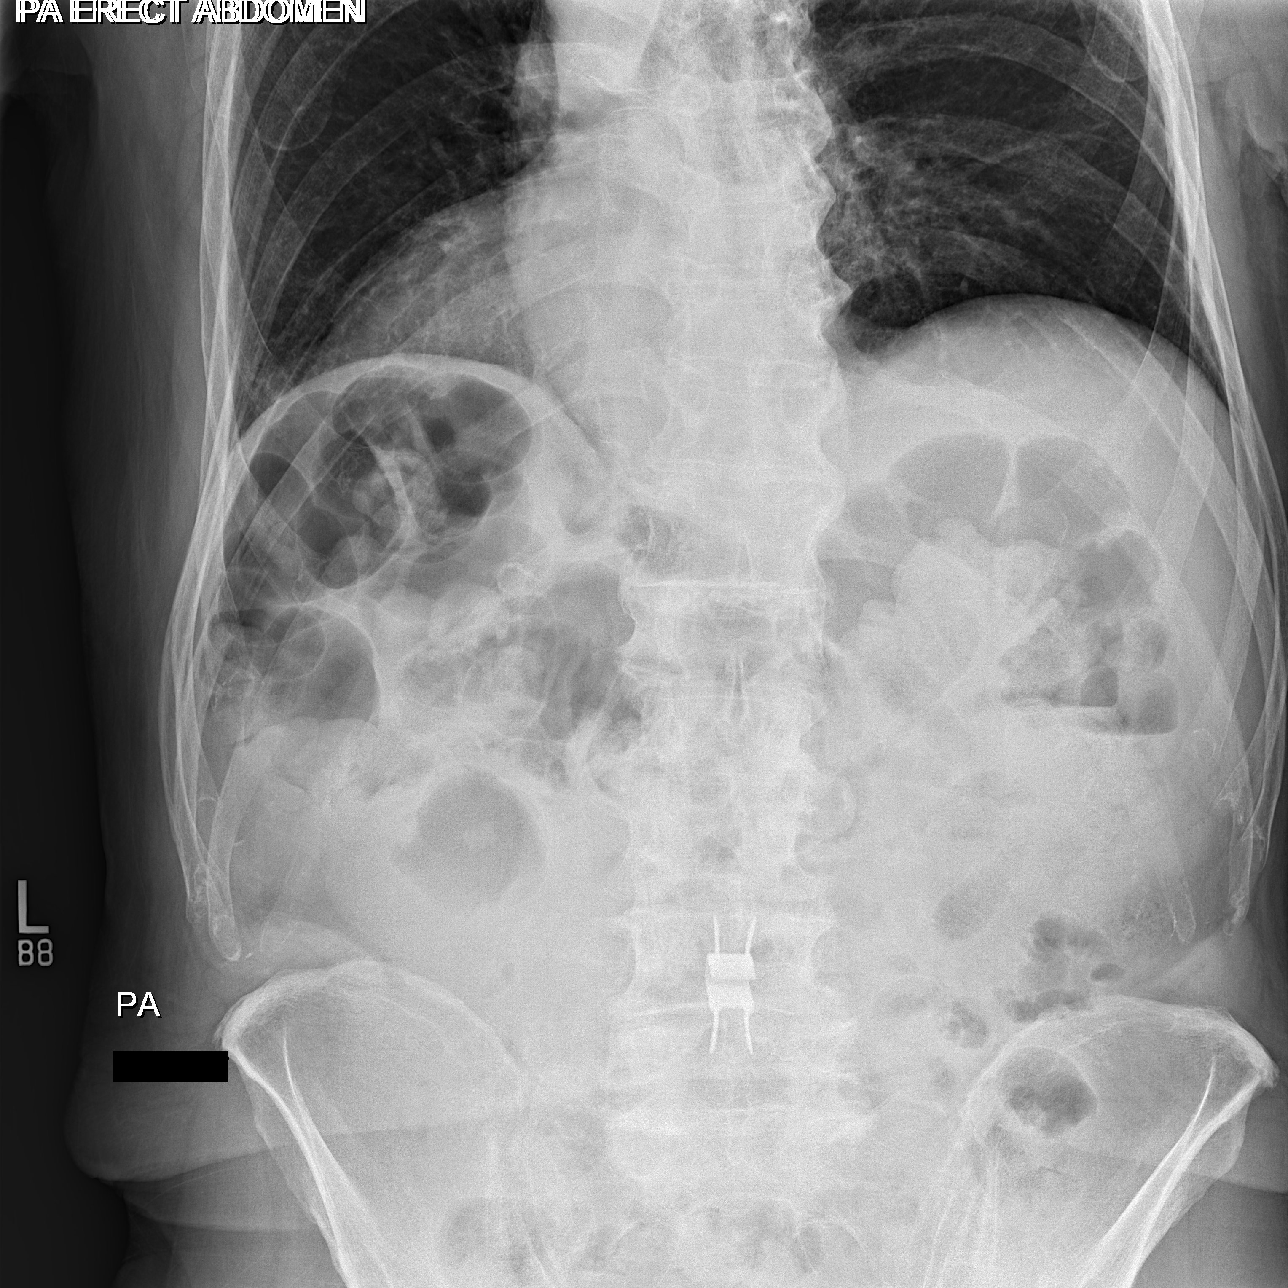

[w chest pa]
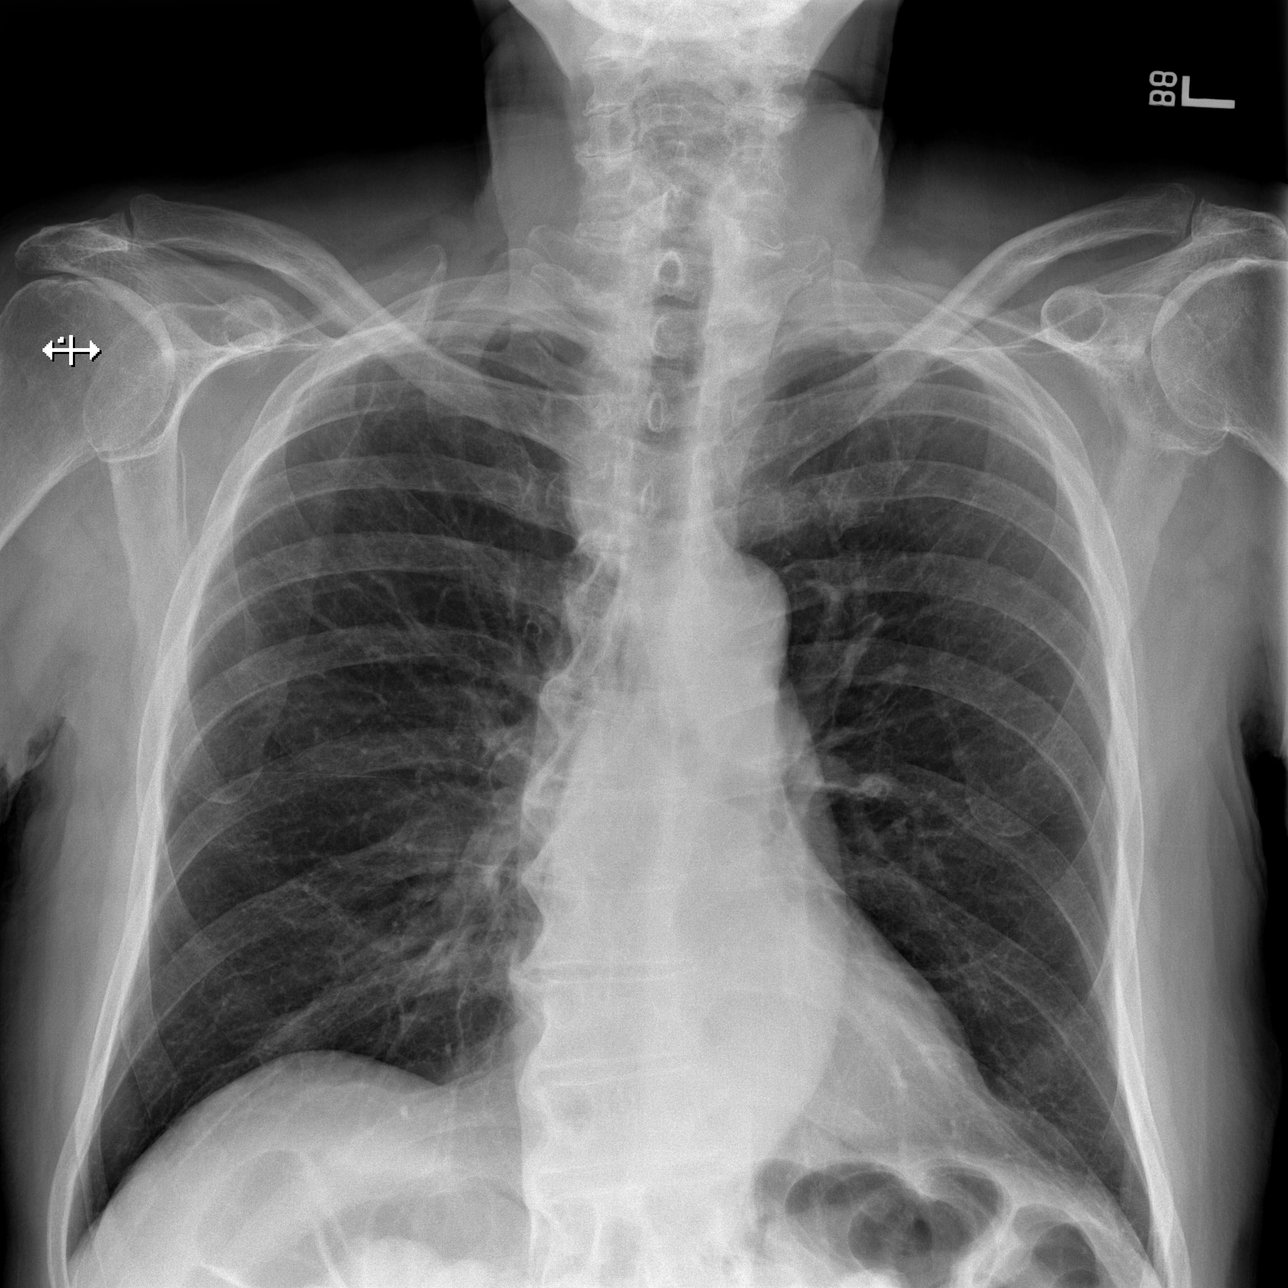

[t abdomen supine]
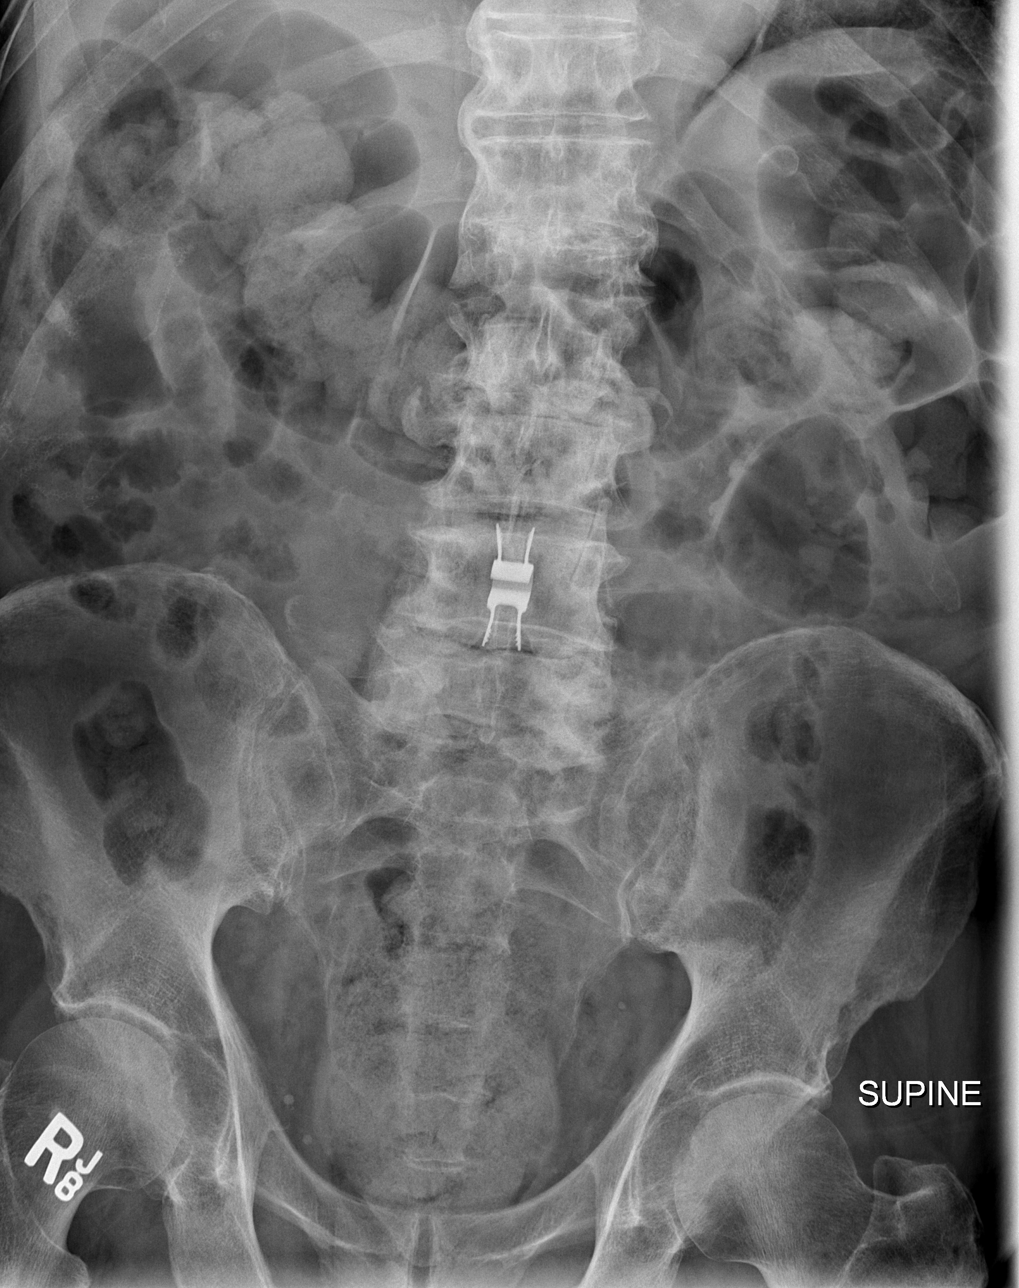

[3 of 3 positions shown; findings below may reference images not displayed]

FINDINGS: The lungs are clear without focal pneumonia, edema, pneumothorax or
pleural effusion. The cardiopericardial silhouette is within normal
limits for size. The visualized bony structures of the thorax are
intact.

Upright film shows no evidence for intraperitoneal free air. Mild
diffuse gaseous colonic distention noted with prominent stool volume
in the rectum. Fixation device noted posterior elements L3-4.
IMPRESSION: 1. No acute cardiopulmonary findings.
2. Diffuse gaseous distention of colon with prominent stool in the
distal colon. Imaging features could be compatible with clinical
constipation.
